# Patient Record
Sex: Female | Born: 1995
Health system: Southern US, Community
[De-identification: ages and names within clinical notes are randomized; demographics above are authoritative.]

## PROBLEM LIST (undated history)

## (undated) ENCOUNTER — Emergency Department (HOSPITAL_COMMUNITY): Discharge status: Absent without leave | Payer: BLUE CROSS/BLUE SHIELD

## (undated) ENCOUNTER — Inpatient Hospital Stay (HOSPITAL_COMMUNITY): Payer: Self-pay

## (undated) DIAGNOSIS — N390 Urinary tract infection, site not specified: Secondary | ICD-10-CM

## (undated) DIAGNOSIS — L732 Hidradenitis suppurativa: Secondary | ICD-10-CM

## (undated) DIAGNOSIS — F32A Depression, unspecified: Secondary | ICD-10-CM

## (undated) DIAGNOSIS — A599 Trichomoniasis, unspecified: Secondary | ICD-10-CM

## (undated) DIAGNOSIS — S82899A Other fracture of unspecified lower leg, initial encounter for closed fracture: Secondary | ICD-10-CM

## (undated) DIAGNOSIS — J45909 Unspecified asthma, uncomplicated: Secondary | ICD-10-CM

## (undated) HISTORY — PX: NO PAST SURGERIES: SHX2092

---
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
## 2016-01-03 ENCOUNTER — Encounter (HOSPITAL_COMMUNITY): Payer: Self-pay | Admitting: *Deleted

## 2016-01-03 ENCOUNTER — Emergency Department (HOSPITAL_COMMUNITY): Payer: Medicaid Other

## 2016-01-03 ENCOUNTER — Emergency Department (HOSPITAL_COMMUNITY)
Admission: EM | Admit: 2016-01-03 | Discharge: 2016-01-03 | Disposition: A | Discharge status: Home / Self Care | Payer: Medicaid Other | Attending: Emergency Medicine | Admitting: Emergency Medicine

## 2016-01-03 DIAGNOSIS — R51 Headache: Secondary | ICD-10-CM | POA: Diagnosis present

## 2016-01-03 DIAGNOSIS — Z79899 Other long term (current) drug therapy: Secondary | ICD-10-CM | POA: Insufficient documentation

## 2016-01-03 DIAGNOSIS — Z87891 Personal history of nicotine dependence: Secondary | ICD-10-CM | POA: Insufficient documentation

## 2016-01-03 DIAGNOSIS — G43909 Migraine, unspecified, not intractable, without status migrainosus: Secondary | ICD-10-CM | POA: Diagnosis not present

## 2016-01-03 DIAGNOSIS — Z7951 Long term (current) use of inhaled steroids: Secondary | ICD-10-CM | POA: Insufficient documentation

## 2016-01-03 DIAGNOSIS — R030 Elevated blood-pressure reading, without diagnosis of hypertension: Secondary | ICD-10-CM | POA: Diagnosis not present

## 2016-01-03 DIAGNOSIS — Z792 Long term (current) use of antibiotics: Secondary | ICD-10-CM | POA: Insufficient documentation

## 2016-01-03 DIAGNOSIS — J45909 Unspecified asthma, uncomplicated: Secondary | ICD-10-CM | POA: Diagnosis not present

## 2016-01-03 MED ORDER — KETOROLAC TROMETHAMINE 30 MG/ML IJ SOLN
30.0000 mg | Freq: Once | INTRAMUSCULAR | Status: AC
  Administered 2016-01-03: 30 mg via INTRAVENOUS
  Filled 2016-01-03: qty 1

## 2016-01-03 MED ORDER — METOCLOPRAMIDE HCL 5 MG/ML IJ SOLN
10.0000 mg | Freq: Once | INTRAMUSCULAR | Status: AC
  Administered 2016-01-03: 10 mg via INTRAVENOUS
  Filled 2016-01-03: qty 2

## 2016-01-03 MED ORDER — DIPHENHYDRAMINE HCL 50 MG/ML IJ SOLN
25.0000 mg | Freq: Once | INTRAMUSCULAR | Status: AC
  Administered 2016-01-03: 25 mg via INTRAVENOUS
  Filled 2016-01-03: qty 1

## 2016-01-03 NOTE — ED Provider Notes (Signed)
CSN: [id number]     Arrival date & time 01/03/16  1406 History   First MD Initiated Contact with Patient 01/03/16 1759     Chief Complaint  Patient presents with  . Eye Pain  . Hypertension     (Consider location/radiation/quality/duration/timing/severity/associated sxs/prior Treatment) Patient is a 20 y.o. female presenting with eye pain. The history is provided by the patient and medical records. No language interpreter was used.  Eye Pain Associated symptoms include headaches and nausea. Pertinent negatives include no abdominal pain, chills, congestion, coughing, fever, neck pain, rash, sore throat, vomiting or weakness.   Crissie SicklesJakeenya S Pennypacker is a 20 y.o. female  with a PMH of asthma who presents to the Emergency Department complaining of throbbing head ache x 1 week. Patient states headache began at the base of her head and radiated up across entire head bilaterally one week ago. 2 days ago, headache began around her left eye and was described as throbbing. She took Tylenol with minimal relief. Patient states headache has been constant, however at times it is better/worse. No aggravating or alleviating factors were noted. Admits to associated photophobia and phonophobia. + nausea, no emesis/abdominal pain. No other medications taken prior to arrival for symptoms. No history of migraines or similar headaches. Denies head trauma or injury. No numbness or tingling, no chest pain, no shortness of breath.  Additionally, patient states she was using her mother's home blood pressure cuff to check her BP and SBP was in the 190s. Upon arrival to ED, BP was 120/78. She has no history of hypertension and states she has never been told she has high blood pressure in the past.  Past Medical History  Diagnosis Date  . Asthma    History reviewed. No pertinent past surgical history. History reviewed. No pertinent family history. Social History  Substance Use Topics  . Smoking status: Former Games developermoker  .  Smokeless tobacco: None  . Alcohol Use: No   OB History    No data available     Review of Systems  Constitutional: Negative for fever and chills.  HENT: Negative for congestion and sore throat.   Eyes: Positive for photophobia. Negative for visual disturbance.  Respiratory: Negative for cough, shortness of breath and wheezing.   Cardiovascular: Negative.   Gastrointestinal: Positive for nausea. Negative for vomiting and abdominal pain.  Genitourinary: Negative for dysuria.  Musculoskeletal: Negative for neck pain and neck stiffness.  Skin: Negative for rash.  Neurological: Positive for headaches. Negative for dizziness and weakness.     Allergies  Review of patient's allergies indicates no known allergies.  Home Medications   Prior to Admission medications   Medication Sig Start Date End Date Taking? Authorizing Provider  acetaminophen (TYLENOL) 325 MG tablet Take 325 mg by mouth every 6 (six) hours as needed for moderate pain.   Yes Historical Provider, MD  albuterol (PROVENTIL HFA;VENTOLIN HFA) 108 (90 BASE) MCG/ACT inhaler Inhale 4 puffs into the lungs every 4 (four) hours as needed for wheezing or shortness of breath. 05/01/13  Yes Niel Hummeross Kuhner, MD  fluticasone (FLONASE) 50 MCG/ACT nasal spray Place 2 sprays into both nostrils daily. 05/05/14   Robyn M Hess, PA-C  guaiFENesin (MUCINEX) 600 MG 12 hr tablet Take 1 tablet (600 mg total) by mouth 2 (two) times daily. 05/05/14   Kathrynn Speedobyn M Hess, PA-C  HYDROcodone-acetaminophen (NORCO/VICODIN) 5-325 MG per tablet Take 1 tablet by mouth every 6 (six) hours as needed for moderate pain. 11/10/14   Charlestine Nighthristopher Lawyer, PA-C  ibuprofen (ADVIL,MOTRIN) 800 MG tablet Take 1 tablet (800 mg total) by mouth every 8 (eight) hours as needed. 11/10/14   Charlestine Night, PA-C  penicillin v potassium (VEETID) 500 MG tablet Take 1 tablet (500 mg total) by mouth 4 (four) times daily. 11/10/14   Charlestine Night, PA-C  Spacer/Aero-Holding Chambers (AEROCHAMBER  PLUS WITH MASK) inhaler Use with inhaler 05/01/13   Niel Hummer, MD  traMADol (ULTRAM) 50 MG tablet Take 1 tablet (50 mg total) by mouth every 6 (six) hours as needed for severe pain. 09/17/14   Fayrene Helper, PA-C   BP 145/64 mmHg  Pulse 62  Temp(Src) 98.6 F (37 C) (Oral)  Resp 18  SpO2 100%  LMP 12/20/2015 Physical Exam  Constitutional: She is oriented to person, place, and time. She appears well-developed and well-nourished. No distress.  HENT:  Head: Normocephalic and atraumatic.  Mouth/Throat: Oropharynx is clear and moist.  No tenderness of the temporal artery   Eyes: Conjunctivae and EOM are normal. Pupils are equal, round, and reactive to light. No scleral icterus.  No nystagmus   Neck: Normal range of motion. Neck supple.  Full active and passive ROM without pain.  No midline or paraspinal tenderness. No nuchal rigidity or meningeal signs.  Cardiovascular: Normal rate, regular rhythm, normal heart sounds and intact distal pulses.   Pulmonary/Chest: Effort normal and breath sounds normal. No respiratory distress. She has no wheezes. She has no rales. She exhibits no tenderness.  Abdominal: Soft. Bowel sounds are normal. She exhibits no distension. There is no tenderness. There is no rebound and no guarding.  Musculoskeletal: Normal range of motion.  Lymphadenopathy:    She has no cervical adenopathy.  Neurological: She is alert and oriented to person, place, and time. She has normal reflexes. No cranial nerve deficit. Coordination normal.  Mental Status: Alert, oriented, and thought content is appropriate. Speech is fluent without evidence of aphasia. Able to follow two-step commands without difficulty.  Cranial Nerves:  II - Peripheral visual fields grossly normal, pupils equal, round, reactive to light III, IV, VI - Bilateral EOM intact, no ptosis V - Facial light touch sensation intact and equal VII - Facial symmetry: smile, raised eyebrows ; Eyelids kept closed against  resistance VIII - Hearing grossly normal bilaterally  IX, X - Uvula midline XI - Bilateral shoulder shrug equal and strong XII - Tongue extension midline Motor:  5/5 muscle strength of upper and lower extremities bilaterally including strong and equal grip strength and plantar/dorsiflexion.  Sensory:  Light touch sensory intact.   Skin: Skin is warm and dry. No rash noted. She is not diaphoretic.  Psychiatric: She has a normal mood and affect. Her behavior is normal. Judgment and thought content normal.  Nursing note and vitals reviewed.   ED Course  Procedures (including critical care time) Labs Review Labs Reviewed - No data to display  Imaging Review Ct Head Wo Contrast  01/03/2016  CLINICAL DATA:  One week history of headache EXAM: CT HEAD WITHOUT CONTRAST TECHNIQUE: Contiguous axial images were obtained from the base of the skull through the vertex without intravenous contrast. COMPARISON:  None. FINDINGS: The ventricles are normal in size and configuration. There is no intracranial mass, hemorrhage, extra-axial fluid collection, or midline shift. Gray-white compartments appear normal. No acute infarct evident. Bony calvarium appears intact. The mastoid air cells are clear. Visualized orbits appear symmetric bilaterally. IMPRESSION: Study within normal limits. Electronically Signed   By: Bretta Bang III M.D.   On: 01/03/2016 20:12  I have personally reviewed and evaluated these images and lab results as part of my medical decision-making.   EKG Interpretation None      MDM   Final diagnoses:  Migraine without status migrainosus, not intractable, unspecified migraine type   Crissie Sickles presents to ED for headache 1 week, acute worsening over the last 2 days. No head trauma. No focal neuro deficits on exam. Given no past medical history of migraine/similar headaches will obtain head CT. Migraine cocktail given.  CT head normal.   8:08 PM - patient reevaluated  and feels much improved, headache resolved.  Evaluation does not show pathology that would require ongoing emergent intervention or inpatient treatment. Patient is hemodynamically stable and mentating appropriately. PCP follow up strongly encourage. Return precautions discussed and all questions answered.  Discussed findings and plan with patient who agrees with treatment plan as dictated.   [facility] Ward, PA-C 01/03/16 2023  Mancel Bale, MD 01/03/16 2226

## 2016-01-03 NOTE — Discharge Instructions (Signed)
1. Medications: usual home medications 2. Treatment: rest, drink plenty of fluids, if headache persists take ibuprofen with caffeine 3. Follow Up: Please follow up with your primary doctor in 3 days for discussion of your diagnoses and further evaluation after today's visit- please call phone number as we discussed for helping finding a primary physician. Please return to the ER for double vision, speech difficulty, gait disturbance, persistent vomiting or other concerns.

## 2016-01-03 NOTE — ED Notes (Signed)
Pt reports discomfort to back of left eye x 2 days. Pt has checked bp at home and states its been high. No neuro deficits noted at triage, bp 120/78.

## 2016-01-03 NOTE — ED Notes (Signed)
Patient transported to CT scan . 

## 2016-06-05 ENCOUNTER — Emergency Department (HOSPITAL_COMMUNITY)
Admission: EM | Admit: 2016-06-05 | Discharge: 2016-06-05 | Disposition: A | Discharge status: Home / Self Care | Payer: Medicaid Other | Attending: Emergency Medicine | Admitting: Emergency Medicine

## 2016-06-05 ENCOUNTER — Encounter (HOSPITAL_COMMUNITY): Payer: Self-pay | Admitting: Emergency Medicine

## 2016-06-05 DIAGNOSIS — J45901 Unspecified asthma with (acute) exacerbation: Secondary | ICD-10-CM | POA: Insufficient documentation

## 2016-06-05 DIAGNOSIS — J069 Acute upper respiratory infection, unspecified: Secondary | ICD-10-CM

## 2016-06-05 DIAGNOSIS — Z87891 Personal history of nicotine dependence: Secondary | ICD-10-CM | POA: Insufficient documentation

## 2016-06-05 MED ORDER — ALBUTEROL SULFATE HFA 108 (90 BASE) MCG/ACT IN AERS
1.0000 | INHALATION_SPRAY | Freq: Once | RESPIRATORY_TRACT | Status: AC
  Administered 2016-06-05: 1 via RESPIRATORY_TRACT
  Filled 2016-06-05: qty 6.7

## 2016-06-05 MED ORDER — PREDNISONE 20 MG PO TABS
60.0000 mg | ORAL_TABLET | Freq: Once | ORAL | Status: AC
  Administered 2016-06-05: 60 mg via ORAL
  Filled 2016-06-05: qty 3

## 2016-06-05 MED ORDER — PREDNISONE 10 MG PO TABS: 60.0000 mg | ORAL_TABLET | Freq: Every day | ORAL | 0 refills | Status: DC

## 2016-06-05 MED ORDER — ALBUTEROL (5 MG/ML) CONTINUOUS INHALATION SOLN
10.0000 mg/h | INHALATION_SOLUTION | Freq: Once | RESPIRATORY_TRACT | Status: AC
  Administered 2016-06-05: 10 mg/h via RESPIRATORY_TRACT
  Filled 2016-06-05: qty 20

## 2016-06-05 MED ORDER — IPRATROPIUM BROMIDE 0.02 % IN SOLN
0.5000 mg | Freq: Once | RESPIRATORY_TRACT | Status: AC
  Administered 2016-06-05: 0.5 mg via RESPIRATORY_TRACT
  Filled 2016-06-05: qty 2.5

## 2016-06-05 NOTE — Discharge Instructions (Signed)
We saw you in the ER for chest discomfort and shortness of breath. We think what you have is a viral syndrome - the treatment for which is symptomatic relief only, and your body will fight the infection off in a few days. We are prescribing you some meds for asthma flair.  Please return to the ER if your symptoms worsen; you have increased pain, fevers, chills, worsening shortness of breath.

## 2016-06-05 NOTE — Progress Notes (Signed)
RT did PEAK Flow with patient. Patient got 150 on peak flow. Patient not able to take deep breath in at this time. RT started continuous neb

## 2016-06-05 NOTE — ED Provider Notes (Signed)
[facility]-EMERGENCY DEPT Provider Note   CSN: [id number] Arrival date & time: 06/05/16  0524     History   Chief Complaint Chief Complaint  Patient presents with  . Chest Pain    HPI Pamela Wong is a 20 y.o. female.  HPI SUBJECTIVE:  Pamela Wong is a 20 y.o. female seen urgently with "cold like symptoms" for the few days. Pt reports that she had some chest discomfort, midsternal in the middle of the night, so she came to the ER. Pt has possible wheezing/ She also has a dry cough. No sore throat. Pt has subjective fevers. Associated symptoms:congestion, sneezing, dry cough, myalgias, fever and chills. Current asthma medications: none. Patient denies smoke cigarettes.  OBJECTIVE:  The patient appears alert, well appearing, and in no distress, oriented to person, place, and time, acyanotic, in no respiratory distress and well hydrated. ENT: ENT exam normal, no neck nodes or sinus tenderness, neck without nodes, pharynx erythematous without exudate and nasal mucosa congested CHEST:clear to auscultation, poor aeration, no wheezes, rales or rhonchi, symmetric air entry, no tachypnea, retractions or cyanosis    Past Medical History:  Diagnosis Date  . Asthma     There are no active problems to display for this patient.   History reviewed. No pertinent surgical history.  OB History    No data available       Home Medications    Prior to Admission medications   Medication Sig Start Date End Date Taking? Authorizing Provider  acetaminophen (TYLENOL) 325 MG tablet Take 325 mg by mouth every 6 (six) hours as needed for moderate pain.   Yes Historical Provider, MD  albuterol (PROVENTIL HFA;VENTOLIN HFA) 108 (90 BASE) MCG/ACT inhaler Inhale 4 puffs into the lungs every 4 (four) hours as needed for wheezing or shortness of breath. 05/01/13  Yes Niel Hummeross Kuhner, MD  predniSONE (DELTASONE) 10 MG tablet Take 6 tablets (60 mg total) by mouth daily. 06/05/16   Derwood KaplanAnkit Cejay Cambre, MD     Family History No family history on file.  Social History Social History  Substance Use Topics  . Smoking status: Former Games developermoker  . Smokeless tobacco: Never Used  . Alcohol use No     Allergies   Review of patient's allergies indicates no known allergies.   Review of Systems Review of Systems  ROS 10 Systems reviewed and are negative for acute change except as noted in the HPI.     Physical Exam Updated Vital Signs BP 134/95   Pulse (!) 122   Temp 99.5 F (37.5 C) (Oral)   Resp 22   Ht 5' 6.5" (1.689 m)   Wt 190 lb (86.2 kg)   LMP 06/05/2016 (Exact Date)   SpO2 100%   BMI 30.21 kg/m   Physical Exam  Constitutional: She is oriented to person, place, and time. She appears well-developed.  HENT:  Head: Normocephalic and atraumatic.  Eyes: Conjunctivae and EOM are normal. Pupils are equal, round, and reactive to light.  Neck: Normal range of motion. Neck supple.  Cardiovascular: Normal rate, regular rhythm and normal heart sounds.   Pulmonary/Chest: Effort normal and breath sounds normal. No respiratory distress. She has no wheezes. She has no rales.  Abdominal: Soft. Bowel sounds are normal. She exhibits no distension. There is no tenderness. There is no rebound and no guarding.  Musculoskeletal: She exhibits no edema, tenderness or deformity.  Neurological: She is alert and oriented to person, place, and time.  Skin: Skin is warm and dry.  Nursing note and vitals reviewed.    ED Treatments / Results  Labs (all labs ordered are listed, but only abnormal results are displayed) Labs Reviewed - No data to display  EKG  EKG Interpretation  Date/Time:  Monday June 05 2016 05:36:46 EDT Ventricular Rate:  90 PR Interval:    QRS Duration: 81 QT Interval:  340 QTC Calculation: 416 R Axis:   63 Text Interpretation:  Sinus rhythm Nonspecific T wave abnormality  in lead III No acute changes Confirmed by Rhunette Croft, MD, Janey Genta ([id number]) on 06/05/2016 6:58:46  AM       Radiology No results found.  Procedures Procedures (including critical care time)  Medications Ordered in ED Medications  albuterol (PROVENTIL HFA;VENTOLIN HFA) 108 (90 Base) MCG/ACT inhaler 1 puff (not administered)  albuterol (PROVENTIL,VENTOLIN) solution continuous neb (10 mg/hr Nebulization Given 06/05/16 0606)  predniSONE (DELTASONE) tablet 60 mg (60 mg Oral Given 06/05/16 0559)  ipratropium (ATROVENT) nebulizer solution 0.5 mg (0.5 mg Nebulization Given 06/05/16 0606)     Initial Impression / Assessment and Plan / ED Course  I have reviewed the triage vital signs and the nursing notes.  Pertinent labs & imaging results that were available during my care of the patient were reviewed by me and considered in my medical decision making (see chart for details).  Clinical Course  Comment By Time  Repeat exam reveals clearing of wheezing in all lung fields. Patient is not in any respiratory distress nor is there hypoxia.  Derwood Kaplan, MD 10/02 [phone]  Repeat exam reveals clearing improved aeration in all lung fields. Patient is not in any respiratory distress nor is there hypoxia. Still no wheezing or focal abnormal lung sounds - so CXR not ordered. Strict ER return precautions have been discussed, and patient is agreeing with the plan and is comfortable with the workup done and the recommendations from the ER.   Derwood Kaplan, MD 10/02 [phone]    Pt with URI like symptoms for the past few days comes in with some chest discomfort. Pt has cough. Lungs are clear - but tight. We will get nebs. No CXR for now. We will reassess post nebs. PE and ACS not high on the ddx. Screening EKG ordered. Trops not needed. Myocarditis/Pericarditis not appreciated based on hx, exam and ekg.  Final Clinical Impressions(s) / ED Diagnoses   Final diagnoses:  Acute URI  Exacerbation of asthma, unspecified asthma severity, unspecified whether persistent    New Prescriptions New  Prescriptions   PREDNISONE (DELTASONE) 10 MG TABLET    Take 6 tablets (60 mg total) by mouth daily.     Derwood Kaplan, MD 06/05/16 [phone]

## 2016-06-05 NOTE — ED Notes (Signed)
EDP at bedside  

## 2016-06-05 NOTE — ED Triage Notes (Signed)
Pt reports cold sx with dry cough. Pt reports generalized chest pain that woke her up this morning. Pt reports fever and chills and wheezing. Pt reports medical hx of asthma.

## 2016-12-11 ENCOUNTER — Encounter (HOSPITAL_COMMUNITY): Payer: Self-pay | Admitting: Emergency Medicine

## 2016-12-11 DIAGNOSIS — T5794XA Toxic effect of unspecified inorganic substance, undetermined, initial encounter: Secondary | ICD-10-CM | POA: Insufficient documentation

## 2016-12-11 DIAGNOSIS — Y999 Unspecified external cause status: Secondary | ICD-10-CM | POA: Insufficient documentation

## 2016-12-11 DIAGNOSIS — L739 Follicular disorder, unspecified: Secondary | ICD-10-CM | POA: Insufficient documentation

## 2016-12-11 DIAGNOSIS — Z23 Encounter for immunization: Secondary | ICD-10-CM | POA: Insufficient documentation

## 2016-12-11 DIAGNOSIS — Y929 Unspecified place or not applicable: Secondary | ICD-10-CM | POA: Insufficient documentation

## 2016-12-11 DIAGNOSIS — T2147XA Corrosion of unspecified degree of female genital region, initial encounter: Secondary | ICD-10-CM | POA: Insufficient documentation

## 2016-12-11 DIAGNOSIS — X58XXXA Exposure to other specified factors, initial encounter: Secondary | ICD-10-CM | POA: Insufficient documentation

## 2016-12-11 DIAGNOSIS — J45909 Unspecified asthma, uncomplicated: Secondary | ICD-10-CM | POA: Insufficient documentation

## 2016-12-11 DIAGNOSIS — Y939 Activity, unspecified: Secondary | ICD-10-CM | POA: Insufficient documentation
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
## 2016-12-12 ENCOUNTER — Emergency Department (HOSPITAL_COMMUNITY)
Admission: EM | Admit: 2016-12-12 | Discharge: 2016-12-12 | Disposition: A | Discharge status: Home / Self Care | Payer: Medicaid Other | Attending: Emergency Medicine | Admitting: Emergency Medicine

## 2016-12-12 DIAGNOSIS — L739 Follicular disorder, unspecified: Secondary | ICD-10-CM

## 2016-12-12 DIAGNOSIS — T304 Corrosion of unspecified body region, unspecified degree: Secondary | ICD-10-CM

## 2016-12-12 MED ORDER — BACITRACIN ZINC 500 UNIT/GM EX OINT: 1.0000 "application " | TOPICAL_OINTMENT | Freq: Two times a day (BID) | CUTANEOUS | 0 refills | Status: DC

## 2016-12-12 MED ORDER — OXYCODONE-ACETAMINOPHEN 5-325 MG PO TABS
1.0000 | ORAL_TABLET | Freq: Once | ORAL | Status: AC
  Administered 2016-12-12: 1 via ORAL
  Filled 2016-12-12: qty 1

## 2016-12-12 MED ORDER — NAPROXEN 500 MG PO TABS: 500.0000 mg | ORAL_TABLET | Freq: Two times a day (BID) | ORAL | 0 refills | Status: DC

## 2016-12-12 MED ORDER — TETANUS-DIPHTH-ACELL PERTUSSIS 5-2.5-18.5 LF-MCG/0.5 IM SUSP
0.5000 mL | Freq: Once | INTRAMUSCULAR | Status: AC
  Administered 2016-12-12: 0.5 mL via INTRAMUSCULAR
  Filled 2016-12-12: qty 0.5

## 2016-12-12 MED ORDER — SILVER SULFADIAZINE 1 % EX CREA
TOPICAL_CREAM | Freq: Two times a day (BID) | CUTANEOUS | Status: DC
  Administered 2016-12-12: 02:00:00 via TOPICAL
  Filled 2016-12-12: qty 85

## 2016-12-12 NOTE — ED Notes (Signed)
Pt verbalized understanding discharge instructions and denies any further needs or questions at this time. VS stable, ambulatory and steady gait.   
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
## 2017-02-18 ENCOUNTER — Emergency Department (HOSPITAL_COMMUNITY)
Admission: EM | Admit: 2017-02-18 | Discharge: 2017-02-19 | Disposition: A | Discharge status: Home / Self Care | Payer: Medicaid Other | Attending: Emergency Medicine | Admitting: Emergency Medicine

## 2017-02-18 ENCOUNTER — Encounter (HOSPITAL_COMMUNITY): Payer: Self-pay

## 2017-02-18 DIAGNOSIS — L02415 Cutaneous abscess of right lower limb: Secondary | ICD-10-CM | POA: Insufficient documentation

## 2017-02-18 DIAGNOSIS — Z87891 Personal history of nicotine dependence: Secondary | ICD-10-CM | POA: Insufficient documentation

## 2017-02-18 DIAGNOSIS — J45909 Unspecified asthma, uncomplicated: Secondary | ICD-10-CM | POA: Insufficient documentation

## 2017-02-18 DIAGNOSIS — L0291 Cutaneous abscess, unspecified: Secondary | ICD-10-CM

## 2017-02-18 DIAGNOSIS — Z79899 Other long term (current) drug therapy: Secondary | ICD-10-CM | POA: Insufficient documentation

## 2017-02-18 DIAGNOSIS — L732 Hidradenitis suppurativa: Secondary | ICD-10-CM | POA: Insufficient documentation

## 2017-02-18 MED ORDER — SULFAMETHOXAZOLE-TRIMETHOPRIM 800-160 MG PO TABS: 1.0000 | ORAL_TABLET | Freq: Two times a day (BID) | ORAL | 0 refills | Status: AC

## 2017-02-18 MED ORDER — LIDOCAINE-EPINEPHRINE (PF) 2 %-1:200000 IJ SOLN
20.0000 mL | Freq: Once | INTRAMUSCULAR | Status: AC
  Administered 2017-02-18: 20 mL
  Filled 2017-02-18: qty 20

## 2017-02-18 MED ORDER — HYDROCODONE-ACETAMINOPHEN 5-325 MG PO TABS: 1.0000 | ORAL_TABLET | ORAL | 0 refills | Status: DC | PRN

## 2017-02-18 NOTE — ED Triage Notes (Signed)
Onset 2 days bump on inside upper right thigh.  Pt reports she gets two a month and usually burst on their own.  This bump is worse and larger.

## 2017-02-18 NOTE — Discharge Instructions (Signed)
Please take Ibuprofen (Advil, motrin) and Tylenol (acetaminophen) to relieve your pain.  You may take up to 800 MG (4 pills) of normal strength ibuprofen every 8 hours as needed.  In between doses of ibuprofen you make take tylenol, up to 1,000 mg (two extra strength pills).  Do not take more than 3,000 mg tylenol in a 24 hour period.  Please check all medication labels as many medications such as pain and cold medications may contain tylenol.  Do not drink while taking these medications.  Do not take other NSAID'S while taking ibuprofen (such as aleve or naproxen).  Your prescription pain medication contains tylenol.     You are being prescribed a medication which may make you sleepy o not drive, operate heavy machinery, care for a small child with out another adult present, or perform any activities that may cause harm to you or someone else if you were to fall asleep or be impaired. Please fat least 24 hours after taking one dose.

## 2017-02-18 NOTE — ED Provider Notes (Signed)
[facility]-EMERGENCY DEPT Provider Note   CSN: [id number] Arrival date & time: 02/18/17  2111  By signing my name below, I, Pamela Wong, attest that this documentation has been prepared under the direction and in the presence of non-physician practitioner, Lyndel Safe, PA-C. Electronically Signed: Modena Wong, Scribe. 02/18/2017. 11:10 PM.  History   Chief Complaint Chief Complaint  Patient presents with  . Abscess   The history is provided by the patient. No language interpreter was used.   HPI Comments: Pamela Wong is a 21 y.o. female who presents to the Emergency Department complaining of constant moderate right thigh bump that started yesterday. She states her bump is worsening. She typically gets  Bumps in the same area twice a month that resolve on their own, but this time is more severe. She has a long-standing history of getting boils to her upper inner thighs, inguinal area, and bilateral axilla however has never seen a doctor for them before. She describes the bump as red, painful, and swollen. She reports associated nausea. Denies any chance of pregnancy (is in a committed relationship with her girlfriend), fever, chills, vomiting, or other complaints at this time.   PCP: None  Past Medical History:  Diagnosis Date  . Asthma     There are no active problems to display for this patient.   History reviewed. No pertinent surgical history.  OB History    No data available        Home Medications    Prior to Admission medications   Medication Sig Start Date End Date Taking? Authorizing Provider  acetaminophen (TYLENOL) 325 MG tablet Take 325 mg by mouth every 6 (six) hours as needed for moderate pain.    [provider]  albuterol (PROVENTIL HFA;VENTOLIN HFA) 108 (90 BASE) MCG/ACT inhaler Inhale 4 puffs into the lungs every 4 (four) hours as needed for wheezing or shortness of breath. 05/01/13   Niel Hummer, MD  bacitracin ointment Apply 1  application topically 2 (two) times daily. 12/12/16   Janne Napoleon, NP  HYDROcodone-acetaminophen (NORCO/VICODIN) 5-325 MG tablet Take 1 tablet by mouth every 4 (four) hours as needed for severe pain. 02/18/17   Cristina Gong, PA-C  naproxen (NAPROSYN) 500 MG tablet Take 1 tablet (500 mg total) by mouth 2 (two) times daily. 12/12/16   Janne Napoleon, NP  predniSONE (DELTASONE) 10 MG tablet Take 6 tablets (60 mg total) by mouth daily. 06/05/16   Derwood Kaplan, MD  sulfamethoxazole-trimethoprim (BACTRIM DS,SEPTRA DS) 800-160 MG tablet Take 1 tablet by mouth 2 (two) times daily. 02/18/17 02/25/17  Cristina Gong, PA-C    Family History History reviewed. No pertinent family history.  Social History Social History  Substance Use Topics  . Smoking status: Former Smoker    Years: 10.00    Types: Cigarettes    Quit date: 01/2016  . Smokeless tobacco: Never Used  . Alcohol use No     Allergies   Patient has no known allergies.   Review of Systems Review of Systems  Constitutional: Negative for chills and fever.  Gastrointestinal: Positive for nausea. Negative for vomiting.  Musculoskeletal: Positive for myalgias.  Skin: Positive for color change.       +bump     Physical Exam Updated Vital Signs BP (!) 142/78 (BP Location: Left Arm)   Pulse 91   Temp 98.4 F (36.9 C) (Oral)   Resp 18   LMP 01/23/2017   SpO2 100%   Physical Exam  Constitutional:  She appears well-developed and well-nourished. No distress.  HENT:  Head: Normocephalic.  Cardiovascular: Normal rate and intact distal pulses.   Neurological: She is alert. No sensory deficit.  Skin: Skin is warm and dry.  6x8 area of erythema to right upper medial thigh. There is a 1 cm x 1 cm area of fluctuance in the middle. The entire area is tender to palpation. No obvious drainage.  Psychiatric: She has a normal mood and affect. Her behavior is normal.  Nursing note and vitals reviewed.    ED Treatments /  Results  DIAGNOSTIC STUDIES: Oxygen Saturation is 100% on RA, normal by my interpretation.    COORDINATION OF CARE: 11:14 PM- Pt advised of plan for treatment and pt agrees.  Labs (all labs ordered are listed, but only abnormal results are displayed) Labs Reviewed - No data to display  EKG  EKG Interpretation None       Radiology No results found.  Procedures .Marland KitchenIncision and Drainage Date/Time: 02/18/2017 11:18 PM Performed by: Cristina Gong Authorized by: Cristina Gong   Consent:    Consent obtained:  Verbal   Consent given by:  Patient   Risks discussed:  Bleeding, incomplete drainage, pain, infection and damage to other organs   Alternatives discussed:  No treatment, referral and alternative treatment Location:    Type:  Abscess   Location:  Lower extremity   Lower extremity location:  Leg   Leg location:  R upper leg Pre-procedure details:    Skin preparation:  Chloraprep Anesthesia (see MAR for exact dosages):    Anesthesia method:  Local infiltration   Local anesthetic:  Lidocaine 2% WITH epi Procedure type:    Complexity:  Simple Procedure details:    Incision types:  Stab incision   Incision depth:  Dermal   Scalpel blade:  11   Wound management:  Irrigated with saline and probed and deloculated   Drainage:  Bloody and purulent   Drainage amount:  Scant   Wound treatment:  Wound left open   Packing materials:  None Post-procedure details:    Patient tolerance of procedure:  Tolerated well, no immediate complications Comments:     Before and after the procedure the motor function,strength, sensation, and circulatory function was assessed.  After the procedure patient had intact motor function with 5/5 strength (equal to unaffected side) to all joints distal to the incision site with the only sensory changes attributable to the local anesthetic.       Medications Ordered in ED Medications  lidocaine-EPINEPHrine (XYLOCAINE W/EPI) 2  %-1:200000 (PF) injection 20 mL (20 mLs Infiltration Given 02/18/17 2311)     Initial Impression / Assessment and Plan / ED Course  I have reviewed the triage vital signs and the nursing notes.  Pertinent labs & imaging results that were available during my care of the patient were reviewed by me and considered in my medical decision making (see chart for details).     Patient with skin abscess amenable to incision and drainage.  Patient afebrile and not tachycardic. Abscess was not large enough to warrant packing or drain,  wound recheck in 2 days. Encouraged home warm soaks and flushing.  Mild signs of cellulitis is surrounding skin.  Patient instructed on OTC pain management, and given very limited supply of Norco for breakthrough pain. Will d/c to home with antibiotics based on recurrent nature of infection.  Patient given referral to wellness clinic for further evaluation of these bumps.    Final  Clinical Impressions(s) / ED Diagnoses   Final diagnoses:  Abscess  Hidradenitis    New Prescriptions New Prescriptions   HYDROCODONE-ACETAMINOPHEN (NORCO/VICODIN) 5-325 MG TABLET    Take 1 tablet by mouth every 4 (four) hours as needed for severe pain.   SULFAMETHOXAZOLE-TRIMETHOPRIM (BACTRIM DS,SEPTRA DS) 800-160 MG TABLET    Take 1 tablet by mouth 2 (two) times daily.   I personally performed the services described in this documentation, which was scribed in my presence. The recorded information has been reviewed and is accurate.     Cristina GongHammond, Kaylean Tupou W, PA-C 02/19/17 0101    Benjiman CorePickering, Nathan, MD 02/20/17 [phone]

## 2017-07-25 ENCOUNTER — Other Ambulatory Visit: Payer: Self-pay

## 2017-07-25 DIAGNOSIS — Z87891 Personal history of nicotine dependence: Secondary | ICD-10-CM | POA: Insufficient documentation

## 2017-07-25 DIAGNOSIS — J45909 Unspecified asthma, uncomplicated: Secondary | ICD-10-CM | POA: Insufficient documentation

## 2017-07-25 DIAGNOSIS — N611 Abscess of the breast and nipple: Secondary | ICD-10-CM | POA: Insufficient documentation

## 2017-07-25 DIAGNOSIS — Z79899 Other long term (current) drug therapy: Secondary | ICD-10-CM | POA: Insufficient documentation

## 2017-07-26 ENCOUNTER — Encounter (HOSPITAL_COMMUNITY): Payer: Self-pay | Admitting: Emergency Medicine

## 2017-07-26 ENCOUNTER — Emergency Department (HOSPITAL_COMMUNITY)
Admission: EM | Admit: 2017-07-26 | Discharge: 2017-07-26 | Disposition: A | Discharge status: Home / Self Care | Payer: Self-pay | Attending: Emergency Medicine | Admitting: Emergency Medicine

## 2017-07-26 DIAGNOSIS — N611 Abscess of the breast and nipple: Secondary | ICD-10-CM

## 2017-07-26 MED ORDER — CEPHALEXIN 500 MG PO CAPS: 500.0000 mg | ORAL_CAPSULE | Freq: Four times a day (QID) | ORAL | 0 refills | Status: DC

## 2017-07-26 MED ORDER — SULFAMETHOXAZOLE-TRIMETHOPRIM 800-160 MG PO TABS: 1.0000 | ORAL_TABLET | Freq: Two times a day (BID) | ORAL | 0 refills | Status: AC

## 2017-07-26 NOTE — ED Provider Notes (Signed)
[facility]   CSN: [id number] Arrival date & time: 07/25/17  2351     History   Chief Complaint Chief Complaint  Patient presents with  . Abscess    Breast    HPI Pamela Wong is a 21 y.o. female.  Patient is a 21 year old female with past medical history of asthma and recurrent skin abscesses presenting for evaluation of pain and swelling to the left breast.  This started several days ago and is worsening.  She had drainage from 1 of the swollen areas and presents for evaluation of this.  She denies any fevers or chills.  She denies any vomiting or diarrhea.      Past Medical History:  Diagnosis Date  . Asthma     There are no active problems to display for this patient.   History reviewed. No pertinent surgical history.  OB History    No data available       Home Medications    Prior to Admission medications   Medication Sig Start Date End Date Taking? Authorizing Provider  acetaminophen (TYLENOL) 325 MG tablet Take 325 mg by mouth every 6 (six) hours as needed for moderate pain.    [provider]  albuterol (PROVENTIL HFA;VENTOLIN HFA) 108 (90 BASE) MCG/ACT inhaler Inhale 4 puffs into the lungs every 4 (four) hours as needed for wheezing or shortness of breath. 05/01/13   Niel HummerKuhner, Ross, MD  bacitracin ointment Apply 1 application topically 2 (two) times daily. 12/12/16   Janne NapoleonNeese, Hope M, NP  HYDROcodone-acetaminophen (NORCO/VICODIN) 5-325 MG tablet Take 1 tablet by mouth every 4 (four) hours as needed for severe pain. 02/18/17   Cristina GongHammond, Elizabeth W, PA-C  naproxen (NAPROSYN) 500 MG tablet Take 1 tablet (500 mg total) by mouth 2 (two) times daily. 12/12/16   Janne NapoleonNeese, Hope M, NP  predniSONE (DELTASONE) 10 MG tablet Take 6 tablets (60 mg total) by mouth daily. 06/05/16   Derwood KaplanNanavati, Ankit, MD    Family History No family history on file.  Social History Social History   Tobacco Use  . Smoking  status: Former Smoker    Years: 10.00    Types: Cigarettes    Last attempt to quit: 01/2016    Years since quitting: 1.5  . Smokeless tobacco: Never Used  Substance Use Topics  . Alcohol use: No  . Drug use: No     Allergies   Patient has no known allergies.   Review of Systems Review of Systems  All other systems reviewed and are negative.    Physical Exam Updated Vital Signs BP (!) 146/83   Pulse 79   Temp 98 F (36.7 C) (Oral)   Resp 17   Ht 5\' 5"  (1.651 m)   Wt 97.5 kg (215 lb)   SpO2 99%   BMI 35.78 kg/m   Physical Exam  Constitutional: She is oriented to person, place, and time. She appears well-developed and well-nourished.  HENT:  Head: Normocephalic and atraumatic.  Neck: Normal range of motion. Neck supple.  Musculoskeletal: Normal range of motion.  Neurological: She is alert and oriented to person, place, and time.  Skin: Skin is warm and dry.  There is an area of cellulitis with a central area of drainage to the left breast.  There are other similar areas to the right inner thigh.  Nursing note and vitals reviewed.    ED Treatments / Results  Labs (all labs ordered are listed, but only abnormal  results are displayed) Labs Reviewed - No data to display  EKG  EKG Interpretation None       Radiology No results found.  Procedures Procedures (including critical care time)  Medications Ordered in ED Medications - No data to display   Initial Impression / Assessment and Plan / ED Course  I have reviewed the triage vital signs and the nursing notes.  Pertinent labs & imaging results that were available during my care of the patient were reviewed by me and considered in my medical decision making (see chart for details).  Patient with a draining abscess to the left breast and folliculitis to the right groin.  From listening to her history, it sounds as though she has hidradenitis suppurativa causing recurrent skin infections.  This will be  treated with Keflex and Bactrim, warm soaks, and follow-up as needed.  She has asked for and will be provided with contact information for dermatology follow-up.  Final Clinical Impressions(s) / ED Diagnoses   Final diagnoses:  None    ED Discharge Orders    None       Geoffery Lyonselo, Mansa Willers, MD 07/26/17 0145

## 2017-07-26 NOTE — ED Triage Notes (Signed)
Patient reports 2 skin abscess at left breast onset 2 weeks ago with drainage and swelling . denies fever or chills .

## 2017-07-26 NOTE — Discharge Instructions (Signed)
Keflex and Bactrim as prescribed.  Apply warm soaks as frequently as possible for the next several days.  Return to the emergency department if symptoms significantly worsen or change.

## 2017-07-26 NOTE — ED Notes (Signed)
ED Provider at bedside. 

## 2017-10-17 ENCOUNTER — Other Ambulatory Visit: Payer: Self-pay

## 2017-10-17 ENCOUNTER — Emergency Department (HOSPITAL_COMMUNITY)
Admission: EM | Admit: 2017-10-17 | Discharge: 2017-10-18 | Disposition: A | Discharge status: Home / Self Care | Payer: Self-pay | Attending: Emergency Medicine | Admitting: Emergency Medicine

## 2017-10-17 ENCOUNTER — Encounter (HOSPITAL_COMMUNITY): Payer: Self-pay | Admitting: Emergency Medicine

## 2017-10-17 ENCOUNTER — Emergency Department (HOSPITAL_COMMUNITY): Payer: Self-pay

## 2017-10-17 DIAGNOSIS — J101 Influenza due to other identified influenza virus with other respiratory manifestations: Secondary | ICD-10-CM | POA: Insufficient documentation

## 2017-10-17 DIAGNOSIS — Z87891 Personal history of nicotine dependence: Secondary | ICD-10-CM | POA: Insufficient documentation

## 2017-10-17 DIAGNOSIS — J45909 Unspecified asthma, uncomplicated: Secondary | ICD-10-CM | POA: Insufficient documentation

## 2017-10-17 DIAGNOSIS — Z79899 Other long term (current) drug therapy: Secondary | ICD-10-CM | POA: Insufficient documentation

## 2017-10-17 LAB — URINALYSIS, ROUTINE W REFLEX MICROSCOPIC
BILIRUBIN URINE: NEGATIVE
Glucose, UA: NEGATIVE mg/dL
HGB URINE DIPSTICK: NEGATIVE
KETONES UR: NEGATIVE mg/dL
NITRITE: NEGATIVE
PH: 6 (ref 5.0–8.0)
Protein, ur: NEGATIVE mg/dL
SPECIFIC GRAVITY, URINE: 1.018 (ref 1.005–1.030)

## 2017-10-17 LAB — COMPREHENSIVE METABOLIC PANEL
ALK PHOS: 46 U/L (ref 38–126)
ALT: 19 U/L (ref 14–54)
ANION GAP: 11 (ref 5–15)
AST: 30 U/L (ref 15–41)
Albumin: 4 g/dL (ref 3.5–5.0)
BILIRUBIN TOTAL: 0.6 mg/dL (ref 0.3–1.2)
BUN: 6 mg/dL (ref 6–20)
CALCIUM: 8.7 mg/dL — AB (ref 8.9–10.3)
CO2: 20 mmol/L — ABNORMAL LOW (ref 22–32)
Chloride: 103 mmol/L (ref 101–111)
Creatinine, Ser: 0.64 mg/dL (ref 0.44–1.00)
Glucose, Bld: 90 mg/dL (ref 65–99)
POTASSIUM: 3.3 mmol/L — AB (ref 3.5–5.1)
Sodium: 134 mmol/L — ABNORMAL LOW (ref 135–145)
TOTAL PROTEIN: 8.1 g/dL (ref 6.5–8.1)

## 2017-10-17 LAB — CBC WITH DIFFERENTIAL/PLATELET
BASOS ABS: 0 10*3/uL (ref 0.0–0.1)
BASOS PCT: 0 %
Eosinophils Absolute: 0 10*3/uL (ref 0.0–0.7)
Eosinophils Relative: 0 %
HEMATOCRIT: 42.7 % (ref 36.0–46.0)
Hemoglobin: 14.1 g/dL (ref 12.0–15.0)
LYMPHS PCT: 42 %
Lymphs Abs: 1.4 10*3/uL (ref 0.7–4.0)
MCH: 29.7 pg (ref 26.0–34.0)
MCHC: 33 g/dL (ref 30.0–36.0)
MCV: 89.9 fL (ref 78.0–100.0)
Monocytes Absolute: 0.3 10*3/uL (ref 0.1–1.0)
Monocytes Relative: 8 %
NEUTROS ABS: 1.6 10*3/uL — AB (ref 1.7–7.7)
Neutrophils Relative %: 50 %
PLATELETS: 214 10*3/uL (ref 150–400)
RBC: 4.75 MIL/uL (ref 3.87–5.11)
RDW: 14.5 % (ref 11.5–15.5)
WBC: 3.3 10*3/uL — AB (ref 4.0–10.5)

## 2017-10-17 LAB — I-STAT CG4 LACTIC ACID, ED: Lactic Acid, Venous: 1.32 mmol/L (ref 0.5–1.9)

## 2017-10-17 LAB — I-STAT BETA HCG BLOOD, ED (MC, WL, AP ONLY): I-stat hCG, quantitative: <5 m[IU]/mL (ref <5)

## 2017-10-17 MED ORDER — SODIUM CHLORIDE 0.9 % IV BOLUS (SEPSIS): 1000.0000 mL | Freq: Once | INTRAVENOUS | Status: DC

## 2017-10-17 NOTE — ED Triage Notes (Signed)
Pt presents with flu-like symptoms x 3 days; pt reports lower back spasm and epigastric abd pain

## 2017-10-18 LAB — INFLUENZA PANEL BY PCR (TYPE A & B)
INFLAPCR: POSITIVE — AB
Influenza B By PCR: NEGATIVE

## 2017-10-18 MED ORDER — IBUPROFEN 800 MG PO TABS: 800.0000 mg | ORAL_TABLET | Freq: Three times a day (TID) | ORAL | 0 refills | Status: DC | PRN

## 2017-10-18 MED ORDER — ACETAMINOPHEN-CODEINE 120-12 MG/5ML PO SOLN: 10.0000 mL | ORAL | 0 refills | Status: DC | PRN

## 2017-10-18 MED ORDER — ALBUTEROL SULFATE HFA 108 (90 BASE) MCG/ACT IN AERS
2.0000 | INHALATION_SPRAY | RESPIRATORY_TRACT | Status: DC | PRN
  Administered 2017-10-18: 2 via RESPIRATORY_TRACT
  Filled 2017-10-18: qty 6.7

## 2017-10-18 MED ORDER — AEROCHAMBER PLUS FLO-VU LARGE MISC
Status: AC
  Filled 2017-10-18: qty 1

## 2017-10-18 MED ORDER — AEROCHAMBER PLUS FLO-VU LARGE MISC
Freq: Once | Status: AC
  Administered 2017-10-18: 1

## 2017-10-18 MED ORDER — GUAIFENESIN ER 1200 MG PO TB12: 1.0000 | ORAL_TABLET | Freq: Two times a day (BID) | ORAL | 0 refills | Status: DC

## 2017-10-18 NOTE — ED Notes (Signed)
Attempted IV access without success.  MD aware

## 2017-10-18 NOTE — ED Provider Notes (Signed)
[facility] Provider [facility]   CSN: [id number] Arrival date & time: 10/17/17  [id number]     History   Chief Complaint Chief Complaint  Patient presents with  . Generalized Body Aches  . Fever  . Cough    HPI Pamela Wong is a 22 y.o. female.  HPI Patient presents to the emergency department with flulike symptoms over the last 3 days.  The patient has had abdominal discomfort nausea cough body aches sore throat.  The patient did not take any medications prior to arrival.  Nothing seems to make the condition better or worse.  The patient denies chest pain, shortness of breath, headache,blurred vision, neck pain,  weakness, numbness, dizziness, anorexia, edema,  vomiting, diarrhea, rash, back pain, dysuria, hematemesis, bloody stool, near syncope, or syncope. Past Medical History:  Diagnosis Date  . Asthma     There are no active problems to display for this patient.   History reviewed. No pertinent surgical history.  OB History    No data available       Home Medications    Prior to Admission medications   Medication Sig Start Date End Date Taking? Authorizing Provider  acetaminophen (TYLENOL) 325 MG tablet Take 325 mg by mouth every 6 (six) hours as needed for moderate pain.    [provider]  albuterol (PROVENTIL HFA;VENTOLIN HFA) 108 (90 BASE) MCG/ACT inhaler Inhale 4 puffs into the lungs every 4 (four) hours as needed for wheezing or shortness of breath. 05/01/13   Niel HummerKuhner, Ross, MD  bacitracin ointment Apply 1 application topically 2 (two) times daily. 12/12/16   Janne NapoleonNeese, Hope M, NP  cephALEXin (KEFLEX) 500 MG capsule Take 1 capsule (500 mg total) by mouth 4 (four) times daily. 07/26/17   Geoffery Lyonselo, Douglas, MD  HYDROcodone-acetaminophen (NORCO/VICODIN) 5-325 MG tablet Take 1 tablet by mouth every 4 (four) hours as needed for severe pain. 02/18/17   Cristina GongHammond, Elizabeth W, PA-C  naproxen (NAPROSYN) 500 MG tablet Take 1 tablet (500  mg total) by mouth 2 (two) times daily. 12/12/16   Janne NapoleonNeese, Hope M, NP  predniSONE (DELTASONE) 10 MG tablet Take 6 tablets (60 mg total) by mouth daily. 06/05/16   Derwood KaplanNanavati, Ankit, MD    Family History History reviewed. No pertinent family history.  Social History Social History   Tobacco Use  . Smoking status: Former Smoker    Years: 10.00    Types: Cigarettes    Last attempt to quit: 01/2016    Years since quitting: 1.7  . Smokeless tobacco: Never Used  Substance Use Topics  . Alcohol use: No  . Drug use: No     Allergies   Patient has no known allergies.   Review of Systems Review of Systems All other systems negative except as documented in the HPI. All pertinent positives and negatives as reviewed in the HPI.  Physical Exam Updated Vital Signs BP (!) 142/104   Pulse 63   Temp 99.3 F (37.4 C) (Oral)   Resp 20   Ht 5\' 7"  (1.702 m)   Wt 85.7 kg (189 lb)   LMP 10/10/2017   SpO2 100%   BMI 29.60 kg/m   Physical Exam  Constitutional: She is oriented to person, place, and time. She appears well-developed and well-nourished. No distress.  HENT:  Head: Normocephalic and atraumatic.  Mouth/Throat: Oropharynx is clear and moist.  Eyes: Pupils are equal, round, and reactive to light.  Neck: Normal range of motion. Neck supple.  Cardiovascular:  Normal rate, regular rhythm and normal heart sounds. Exam reveals no gallop and no friction rub.  No murmur heard. Pulmonary/Chest: Effort normal and breath sounds normal. No respiratory distress. She has no wheezes.  Abdominal: Soft. Bowel sounds are normal. She exhibits no distension. There is no tenderness.  Neurological: She is alert and oriented to person, place, and time. She exhibits normal muscle tone. Coordination normal.  Skin: Skin is warm and dry. Capillary refill takes less than 2 seconds. No rash noted. No erythema.  Psychiatric: She has a normal mood and affect. Her behavior is normal.  Nursing note and vitals  reviewed.    ED Treatments / Results  Labs (all labs ordered are listed, but only abnormal results are displayed) Labs Reviewed  COMPREHENSIVE METABOLIC PANEL - Abnormal; Notable for the following components:      Result Value   Sodium 134 (*)    Potassium 3.3 (*)    CO2 20 (*)    Calcium 8.7 (*)    All other components within normal limits  CBC WITH DIFFERENTIAL/PLATELET - Abnormal; Notable for the following components:   WBC 3.3 (*)    Neutro Abs 1.6 (*)    All other components within normal limits  URINALYSIS, ROUTINE W REFLEX MICROSCOPIC - Abnormal; Notable for the following components:   APPearance CLOUDY (*)    Leukocytes, UA TRACE (*)    Bacteria, UA RARE (*)    Squamous Epithelial / LPF 6-30 (*)    All other components within normal limits  INFLUENZA PANEL BY PCR (TYPE A & B) - Abnormal; Notable for the following components:   Influenza A By PCR POSITIVE (*)    All other components within normal limits  I-STAT CG4 LACTIC ACID, ED  I-STAT BETA HCG BLOOD, ED ([facility], [facility], [facility] ONLY)  I-STAT CG4 LACTIC ACID, ED    EKG  EKG Interpretation None       Radiology Dg Chest 2 View  Result Date: 10/17/2017 CLINICAL DATA:  Cough and fever EXAM: CHEST  2 VIEW COMPARISON:  Chest radiograph 07/07/2014 FINDINGS: The heart size and mediastinal contours are within normal limits. Both lungs are clear. The visualized skeletal structures are unremarkable. IMPRESSION: No active cardiopulmonary disease. Electronically Signed   By: Deatra Robinson M.D.   On: 10/17/2017 22:06    Procedures Procedures (including critical care time)  Medications Ordered in ED Medications  sodium chloride 0.9 % bolus 1,000 mL (not administered)     Initial Impression / Assessment and Plan / ED Course  I have reviewed the triage vital signs and the nursing notes.  Pertinent labs & imaging results that were available during my care of the patient were reviewed by me and considered in my medical decision  making (see chart for details).    Patient will be given instructions for fluid hydration at home and she does not want IV fluids.  Patient also given an albuterol inhaler with a spacer told to return for any worsening in her condition.  Patient agrees the plan and all questions were answered.  Final Clinical Impressions(s) / ED Diagnoses   Final diagnoses:  None    ED Discharge Orders    None       Charlestine Night, PA-C 10/18/17 0111    Terrilee Files, MD 10/18/17 [phone]

## 2017-10-18 NOTE — ED Notes (Signed)
Pt departed in NAD, refused use of wheelchair.  

## 2017-10-18 NOTE — ED Notes (Signed)
IV unsuccessful at this time.

## 2017-10-18 NOTE — Discharge Instructions (Signed)
Return here as needed.  Increase your fluid intake as much as possible.  Rest as much as possible.  Follow-up with a primary doctor.

## 2017-10-18 NOTE — ED Notes (Signed)
ED Provider at bedside. 

## 2018-08-19 ENCOUNTER — Other Ambulatory Visit: Payer: Self-pay

## 2018-08-19 ENCOUNTER — Encounter (HOSPITAL_COMMUNITY): Payer: Self-pay

## 2018-08-19 ENCOUNTER — Emergency Department (HOSPITAL_COMMUNITY)
Admission: EM | Admit: 2018-08-19 | Discharge: 2018-08-19 | Disposition: A | Discharge status: Home / Self Care | Payer: Self-pay | Attending: Emergency Medicine | Admitting: Emergency Medicine

## 2018-08-19 DIAGNOSIS — Z79899 Other long term (current) drug therapy: Secondary | ICD-10-CM | POA: Insufficient documentation

## 2018-08-19 DIAGNOSIS — J45909 Unspecified asthma, uncomplicated: Secondary | ICD-10-CM | POA: Insufficient documentation

## 2018-08-19 DIAGNOSIS — Z87891 Personal history of nicotine dependence: Secondary | ICD-10-CM | POA: Insufficient documentation

## 2018-08-19 DIAGNOSIS — J069 Acute upper respiratory infection, unspecified: Secondary | ICD-10-CM

## 2018-08-19 DIAGNOSIS — R1013 Epigastric pain: Secondary | ICD-10-CM

## 2018-08-19 LAB — URINALYSIS, ROUTINE W REFLEX MICROSCOPIC
BACTERIA UA: NONE SEEN
BILIRUBIN URINE: NEGATIVE
Glucose, UA: NEGATIVE mg/dL
Hgb urine dipstick: NEGATIVE
Ketones, ur: 5 mg/dL — AB
Nitrite: NEGATIVE
PH: 7 (ref 5.0–8.0)
Protein, ur: NEGATIVE mg/dL
SPECIFIC GRAVITY, URINE: 1.013 (ref 1.005–1.030)

## 2018-08-19 LAB — COMPREHENSIVE METABOLIC PANEL
ALBUMIN: 4.1 g/dL (ref 3.5–5.0)
ALT: 24 U/L (ref 0–44)
ANION GAP: 8 (ref 5–15)
AST: 22 U/L (ref 15–41)
Alkaline Phosphatase: 47 U/L (ref 38–126)
BILIRUBIN TOTAL: 0.7 mg/dL (ref 0.3–1.2)
BUN: 6 mg/dL (ref 6–20)
CALCIUM: 8.9 mg/dL (ref 8.9–10.3)
CO2: 23 mmol/L (ref 22–32)
Chloride: 104 mmol/L (ref 98–111)
Creatinine, Ser: 0.73 mg/dL (ref 0.44–1.00)
Glucose, Bld: 87 mg/dL (ref 70–99)
POTASSIUM: 4 mmol/L (ref 3.5–5.1)
Sodium: 135 mmol/L (ref 135–145)
TOTAL PROTEIN: 7.8 g/dL (ref 6.5–8.1)

## 2018-08-19 LAB — CBC WITH DIFFERENTIAL/PLATELET
Abs Immature Granulocytes: 0.02 10*3/uL (ref 0.00–0.07)
BASOS PCT: 1 %
Basophils Absolute: 0 10*3/uL (ref 0.0–0.1)
EOS ABS: 0.1 10*3/uL (ref 0.0–0.5)
EOS PCT: 1 %
HEMATOCRIT: 43.5 % (ref 36.0–46.0)
Hemoglobin: 13.5 g/dL (ref 12.0–15.0)
IMMATURE GRANULOCYTES: 0 %
LYMPHS ABS: 1.7 10*3/uL (ref 0.7–4.0)
Lymphocytes Relative: 25 %
MCH: 29.2 pg (ref 26.0–34.0)
MCHC: 31 g/dL (ref 30.0–36.0)
MCV: 94 fL (ref 80.0–100.0)
Monocytes Absolute: 0.4 10*3/uL (ref 0.1–1.0)
Monocytes Relative: 6 %
NEUTROS PCT: 67 %
NRBC: 0 % (ref 0.0–0.2)
Neutro Abs: 4.7 10*3/uL (ref 1.7–7.7)
PLATELETS: 233 10*3/uL (ref 150–400)
RBC: 4.63 MIL/uL (ref 3.87–5.11)
RDW: 14 % (ref 11.5–15.5)
WBC: 6.9 10*3/uL (ref 4.0–10.5)

## 2018-08-19 LAB — LIPASE, BLOOD: LIPASE: 27 U/L (ref 11–51)

## 2018-08-19 LAB — I-STAT BETA HCG BLOOD, ED (MC, WL, AP ONLY)

## 2018-08-19 MED ORDER — ALUM & MAG HYDROXIDE-SIMETH 200-200-20 MG/5ML PO SUSP
30.0000 mL | Freq: Once | ORAL | Status: AC
  Administered 2018-08-19: 30 mL via ORAL
  Filled 2018-08-19: qty 30

## 2018-08-19 MED ORDER — DM-GUAIFENESIN ER 30-600 MG PO TB12: 1.0000 | ORAL_TABLET | Freq: Two times a day (BID) | ORAL | 0 refills | Status: DC

## 2018-08-19 MED ORDER — ALBUTEROL SULFATE HFA 108 (90 BASE) MCG/ACT IN AERS
1.0000 | INHALATION_SPRAY | Freq: Once | RESPIRATORY_TRACT | Status: AC
  Administered 2018-08-19: 2 via RESPIRATORY_TRACT
  Filled 2018-08-19: qty 6.7

## 2018-08-19 MED ORDER — ONDANSETRON 4 MG PO TBDP
8.0000 mg | ORAL_TABLET | Freq: Once | ORAL | Status: AC
Start: 2018-08-19 — End: 2018-08-19
  Administered 2018-08-19: 4 mg via ORAL
  Filled 2018-08-19: qty 2

## 2018-08-19 MED ORDER — ONDANSETRON 4 MG PO TBDP: 4.0000 mg | ORAL_TABLET | Freq: Three times a day (TID) | ORAL | 0 refills | Status: DC | PRN

## 2018-08-19 NOTE — ED Provider Notes (Signed)
[facility] EMERGENCY DEPARTMENT Provider Note   CSN: [id number] Arrival date & time: 08/19/18  1420     History   Chief Complaint Chief Complaint  Patient presents with  . Abdominal Pain    HPI Pamela Wong is a 22 y.o. female who presents with URI symptoms and abdominal pain. PMH significant for asthma. She states that her symptoms started last night. She reports subjective fever, headache, bilateral ear pain, runny nose, nasal congestion, sore throat, and a cough. She also has had SOB and wheezing. She ran out of her inhaler. Additionally she endorses epigastric and LUQ abdominal pain. She's had several episodes of vomiting. No constipation, diarrhea, urinary symptoms. No vaginal discharge or bleeding. LMP was the beginning of November.  HPI  Past Medical History:  Diagnosis Date  . Asthma     There are no active problems to display for this patient.   History reviewed. No pertinent surgical history.   OB History   No obstetric history on file.      Home Medications    Prior to Admission medications   Medication Sig Start Date End Date Taking? Authorizing Provider  acetaminophen (TYLENOL) 325 MG tablet Take 325 mg by mouth every 6 (six) hours as needed for moderate pain.    [provider]  acetaminophen-codeine 120-12 MG/5ML solution Take 10 mLs by mouth every 4 (four) hours as needed for moderate pain. 10/18/17   Lawyer, Cristal Deer, PA-C  albuterol (PROVENTIL HFA;VENTOLIN HFA) 108 (90 BASE) MCG/ACT inhaler Inhale 4 puffs into the lungs every 4 (four) hours as needed for wheezing or shortness of breath. 05/01/13   Niel Hummer, MD  bacitracin ointment Apply 1 application topically 2 (two) times daily. 12/12/16   Janne Napoleon, NP  cephALEXin (KEFLEX) 500 MG capsule Take 1 capsule (500 mg total) by mouth 4 (four) times daily. 07/26/17   Geoffery Lyons, MD  Guaifenesin 1200 MG TB12 Take 1 tablet (1,200 mg total) by mouth 2 (two) times  daily. 10/18/17   Lawyer, Cristal Deer, PA-C  HYDROcodone-acetaminophen (NORCO/VICODIN) 5-325 MG tablet Take 1 tablet by mouth every 4 (four) hours as needed for severe pain. 02/18/17   Cristina Gong, PA-C  ibuprofen (ADVIL,MOTRIN) 800 MG tablet Take 1 tablet (800 mg total) by mouth every 8 (eight) hours as needed. 10/18/17   Lawyer, Cristal Deer, PA-C  naproxen (NAPROSYN) 500 MG tablet Take 1 tablet (500 mg total) by mouth 2 (two) times daily. 12/12/16   Janne Napoleon, NP  predniSONE (DELTASONE) 10 MG tablet Take 6 tablets (60 mg total) by mouth daily. 06/05/16   Derwood Kaplan, MD    Family History No family history on file.  Social History Social History   Tobacco Use  . Smoking status: Former Smoker    Years: 10.00    Types: Cigarettes    Last attempt to quit: 01/2016    Years since quitting: 2.6  . Smokeless tobacco: Never Used  Substance Use Topics  . Alcohol use: No  . Drug use: No     Allergies   Patient has no known allergies.   Review of Systems Review of Systems  Constitutional: Positive for fever.  HENT: Positive for congestion, ear pain, sinus pain and sore throat.   Respiratory: Positive for cough, shortness of breath and wheezing.   Cardiovascular: Negative for chest pain.  Gastrointestinal: Positive for abdominal pain, nausea and vomiting. Negative for constipation and diarrhea.  Genitourinary: Negative for dysuria, flank pain, vaginal bleeding and vaginal  discharge.  All other systems reviewed and are negative.    Physical Exam Updated Vital Signs BP 131/89 (BP Location: Right Arm)   Pulse 74   Temp 98.1 F (36.7 C) (Oral)   Resp 16   Ht 5\' 5"  (1.651 m)   Wt 90.7 kg   LMP 07/08/2018 (Approximate)   SpO2 96%   BMI 33.28 kg/m   Physical Exam Vitals signs and nursing note reviewed.  Constitutional:      General: She is not in acute distress.    Appearance: She is well-developed. She is obese. She is not ill-appearing.     Comments: Calm and  cooperative. Sounds congested  HENT:     Head: Normocephalic and atraumatic.     Right Ear: Hearing, tympanic membrane, ear canal and external ear normal.     Left Ear: Hearing, tympanic membrane, ear canal and external ear normal.     Nose: Nose normal.     Mouth/Throat:     Mouth: Mucous membranes are moist.     Pharynx: Oropharynx is clear. Uvula midline.  Eyes:     General: No scleral icterus.       Right eye: No discharge.        Left eye: No discharge.     Conjunctiva/sclera: Conjunctivae normal.     Pupils: Pupils are equal, round, and reactive to light.  Neck:     Musculoskeletal: Normal range of motion.  Cardiovascular:     Rate and Rhythm: Normal rate and regular rhythm.     Heart sounds: No murmur. No friction rub. No gallop.   Pulmonary:     Effort: Pulmonary effort is normal. No respiratory distress.     Breath sounds: Normal breath sounds.  Abdominal:     General: There is no distension.     Palpations: Abdomen is soft.     Tenderness: There is abdominal tenderness (epigastric and LUQ).  Skin:    General: Skin is warm and dry.  Neurological:     Mental Status: She is alert and oriented to person, place, and time.  Psychiatric:        Behavior: Behavior normal.      ED Treatments / Results  Labs (all labs ordered are listed, but only abnormal results are displayed) Labs Reviewed  URINALYSIS, ROUTINE W REFLEX MICROSCOPIC - Abnormal; Notable for the following components:      Result Value   Ketones, ur 5 (*)    Leukocytes, UA SMALL (*)    All other components within normal limits  COMPREHENSIVE METABOLIC PANEL  LIPASE, BLOOD  CBC WITH DIFFERENTIAL/PLATELET  I-STAT BETA HCG BLOOD, ED ([facility], [facility], [facility] ONLY)    EKG None  Radiology No results found.  Procedures Procedures (including critical care time)  Medications Ordered in ED Medications  ondansetron (ZOFRAN-ODT) disintegrating tablet 8 mg (4 mg Oral Given 08/19/18 1449)  albuterol (PROVENTIL  HFA;VENTOLIN HFA) 108 (90 Base) MCG/ACT inhaler 1-2 puff (2 puffs Inhalation Given 08/19/18 1449)  alum & mag hydroxide-simeth (MAALOX/MYLANTA) 200-200-20 MG/5ML suspension 30 mL (30 mLs Oral Given 08/19/18 1529)     Initial Impression / Assessment and Plan / ED Course  I have reviewed the triage vital signs and the nursing notes.  Pertinent labs & imaging results that were available during my care of the patient were reviewed by me and considered in my medical decision making (see chart for details).  22 year old female presents with URI symptoms and epigastric/LUQ pain for the past 1-2  days. Vitals are normal. On exam she has findings consistent with URI and significant epigastric tenderness. Will obtain labs, UA, preg test and reassess.  Labs are normal. UA has 5 ketones and small leukocytes but no bacteria and minimal WBC. Discussed with patient. Will treat for URI and gastritis. Pt was given return precautions.  Final Clinical Impressions(s) / ED Diagnoses   Final diagnoses:  Upper respiratory tract infection, unspecified type  Epigastric pain    ED Discharge Orders    None       Bethel BornGekas, Chason Mciver Marie, PA-C 08/20/18 Arman [id number]    Cook, Brian, MD 08/20/18 [phone]

## 2018-08-19 NOTE — ED Triage Notes (Signed)
Pt reports LUQ pain since his morning with nausea. Pt also reports cold symptoms (sore throat, cough, congestion).

## 2018-08-19 NOTE — Discharge Instructions (Addendum)
Please rest and drink plenty of fluids Take Mucinex DM for cough Take Tylenol for fever/pain Take Zofran for nausea Return if worsening

## 2018-10-14 ENCOUNTER — Other Ambulatory Visit: Payer: Self-pay

## 2018-10-14 ENCOUNTER — Emergency Department (HOSPITAL_COMMUNITY)
Admission: EM | Admit: 2018-10-14 | Discharge: 2018-10-15 | Disposition: A | Discharge status: Home / Self Care | Payer: Self-pay | Attending: Emergency Medicine | Admitting: Emergency Medicine

## 2018-10-14 ENCOUNTER — Encounter (HOSPITAL_COMMUNITY): Payer: Self-pay | Admitting: *Deleted

## 2018-10-14 DIAGNOSIS — N739 Female pelvic inflammatory disease, unspecified: Secondary | ICD-10-CM | POA: Insufficient documentation

## 2018-10-14 DIAGNOSIS — J45909 Unspecified asthma, uncomplicated: Secondary | ICD-10-CM | POA: Insufficient documentation

## 2018-10-14 DIAGNOSIS — Z87891 Personal history of nicotine dependence: Secondary | ICD-10-CM | POA: Insufficient documentation

## 2018-10-14 DIAGNOSIS — Z79899 Other long term (current) drug therapy: Secondary | ICD-10-CM | POA: Insufficient documentation

## 2018-10-14 MED ORDER — SODIUM CHLORIDE 0.9% FLUSH: 3.0000 mL | Freq: Once | INTRAVENOUS | Status: DC

## 2018-10-14 NOTE — ED Triage Notes (Signed)
Pt reports sharp lower abd pain x 4 days with vaginal discharge. Denies urinary symptoms. Has nausea, no vomiting or diarrhea.

## 2018-10-15 LAB — WET PREP, GENITAL
Sperm: NONE SEEN
Trich, Wet Prep: NONE SEEN
Yeast Wet Prep HPF POC: NONE SEEN

## 2018-10-15 LAB — URINALYSIS, ROUTINE W REFLEX MICROSCOPIC
Bilirubin Urine: NEGATIVE
GLUCOSE, UA: NEGATIVE mg/dL
Hgb urine dipstick: NEGATIVE
Ketones, ur: NEGATIVE mg/dL
NITRITE: NEGATIVE
PH: 6 (ref 5.0–8.0)
Protein, ur: NEGATIVE mg/dL
Specific Gravity, Urine: 1.016 (ref 1.005–1.030)

## 2018-10-15 LAB — GC/CHLAMYDIA PROBE AMP (~~LOC~~) NOT AT ARMC
CHLAMYDIA, DNA PROBE: NEGATIVE
NEISSERIA GONORRHEA: POSITIVE — AB

## 2018-10-15 LAB — COMPREHENSIVE METABOLIC PANEL
ALT: 12 U/L (ref 0–44)
AST: 15 U/L (ref 15–41)
Albumin: 3.9 g/dL (ref 3.5–5.0)
Alkaline Phosphatase: 42 U/L (ref 38–126)
Anion gap: 6 (ref 5–15)
BUN: 6 mg/dL (ref 6–20)
CHLORIDE: 106 mmol/L (ref 98–111)
CO2: 25 mmol/L (ref 22–32)
CREATININE: 0.63 mg/dL (ref 0.44–1.00)
Calcium: 9 mg/dL (ref 8.9–10.3)
GFR calc non Af Amer: >60 mL/min (ref >60)
Glucose, Bld: 94 mg/dL (ref 70–99)
POTASSIUM: 4.1 mmol/L (ref 3.5–5.1)
SODIUM: 137 mmol/L (ref 135–145)
Total Bilirubin: 0.5 mg/dL (ref 0.3–1.2)
Total Protein: 7.8 g/dL (ref 6.5–8.1)

## 2018-10-15 LAB — I-STAT BETA HCG BLOOD, ED (MC, WL, AP ONLY): I-stat hCG, quantitative: <5 m[IU]/mL (ref <5)

## 2018-10-15 LAB — HIV ANTIBODY (ROUTINE TESTING W REFLEX): HIV SCREEN 4TH GENERATION: NONREACTIVE

## 2018-10-15 LAB — CBC
HCT: 39.8 % (ref 36.0–46.0)
Hemoglobin: 12.3 g/dL (ref 12.0–15.0)
MCH: 29.3 pg (ref 26.0–34.0)
MCHC: 30.9 g/dL (ref 30.0–36.0)
MCV: 94.8 fL (ref 80.0–100.0)
PLATELETS: 215 10*3/uL (ref 150–400)
RBC: 4.2 MIL/uL (ref 3.87–5.11)
RDW: 14.1 % (ref 11.5–15.5)
WBC: 12 10*3/uL — AB (ref 4.0–10.5)
nRBC: 0 % (ref 0.0–0.2)

## 2018-10-15 LAB — LIPASE, BLOOD: LIPASE: 24 U/L (ref 11–51)

## 2018-10-15 LAB — RPR: RPR Ser Ql: NONREACTIVE

## 2018-10-15 MED ORDER — HYDROCODONE-ACETAMINOPHEN 5-325 MG PO TABS: 1.0000 | ORAL_TABLET | ORAL | 0 refills | Status: DC | PRN

## 2018-10-15 MED ORDER — DOXYCYCLINE HYCLATE 100 MG PO CAPS: 100.0000 mg | ORAL_CAPSULE | Freq: Two times a day (BID) | ORAL | 0 refills | Status: DC

## 2018-10-15 MED ORDER — METRONIDAZOLE 500 MG PO TABS: 500.0000 mg | ORAL_TABLET | Freq: Two times a day (BID) | ORAL | 0 refills | Status: DC

## 2018-10-15 MED ORDER — CEFTRIAXONE SODIUM 250 MG IJ SOLR
250.0000 mg | Freq: Once | INTRAMUSCULAR | Status: AC
  Administered 2018-10-15: 250 mg via INTRAMUSCULAR
  Filled 2018-10-15: qty 250

## 2018-10-15 MED ORDER — METRONIDAZOLE 500 MG PO TABS
500.0000 mg | ORAL_TABLET | Freq: Once | ORAL | Status: AC
  Administered 2018-10-15: 500 mg via ORAL
  Filled 2018-10-15: qty 1

## 2018-10-15 MED ORDER — STERILE WATER FOR INJECTION IJ SOLN
INTRAMUSCULAR | Status: AC
  Filled 2018-10-15: qty 10

## 2018-10-15 MED ORDER — AZITHROMYCIN 250 MG PO TABS
1000.0000 mg | ORAL_TABLET | Freq: Once | ORAL | Status: AC
Start: 2018-10-15 — End: 2018-10-15
  Administered 2018-10-15: 1000 mg via ORAL
  Filled 2018-10-15: qty 4

## 2018-10-15 MED ORDER — OXYCODONE-ACETAMINOPHEN 5-325 MG PO TABS
1.0000 | ORAL_TABLET | Freq: Once | ORAL | Status: AC
  Administered 2018-10-15: 1 via ORAL
  Filled 2018-10-15: qty 1

## 2018-10-15 NOTE — ED Provider Notes (Signed)
[facility] Provider Note   CSN: [id number] Arrival date & time: 10/14/18  2309     History   Chief Complaint Chief Complaint  Patient presents with  . Abdominal Pain    HPI Pamela Wong is a 23 y.o. female.  The history is provided by the patient.  She has history of asthma and comes in with complaints of lower abdominal pain for the last 3 days.  Pain is severe and she rates it at 10/10.  Pain is worse with walking.  She denies fever, chills, sweats.  She denies nausea or vomiting.  She denies constipation or diarrhea.  She denies any urinary difficulty.  She has had a copious vaginal discharge over the last 2 days.  Last menses was January 21 and was normal.  She is not using any contraception.  Past Medical History:  Diagnosis Date  . Asthma     There are no active problems to display for this patient.   History reviewed. No pertinent surgical history.   OB History   No obstetric history on file.      Home Medications    Prior to Admission medications   Medication Sig Start Date End Date Taking? Authorizing Provider  acetaminophen (TYLENOL) 325 MG tablet Take 325 mg by mouth every 6 (six) hours as needed for moderate pain.    [provider]  acetaminophen-codeine 120-12 MG/5ML solution Take 10 mLs by mouth every 4 (four) hours as needed for moderate pain. 10/18/17   Lawyer, Cristal Deerhristopher, PA-C  albuterol (PROVENTIL HFA;VENTOLIN HFA) 108 (90 BASE) MCG/ACT inhaler Inhale 4 puffs into the lungs every 4 (four) hours as needed for wheezing or shortness of breath. 05/01/13   Niel HummerKuhner, Ross, MD  bacitracin ointment Apply 1 application topically 2 (two) times daily. 12/12/16   Janne NapoleonNeese, Hope M, NP  cephALEXin (KEFLEX) 500 MG capsule Take 1 capsule (500 mg total) by mouth 4 (four) times daily. 07/26/17   Geoffery Lyonselo, Douglas, MD  dextromethorphan-guaiFENesin (MUCINEX DM) 30-600 MG 12hr tablet Take 1 tablet by mouth 2 (two) times daily.  08/19/18   Bethel BornGekas, Kelly Marie, PA-C  Guaifenesin 1200 MG TB12 Take 1 tablet (1,200 mg total) by mouth 2 (two) times daily. 10/18/17   Lawyer, Cristal Deerhristopher, PA-C  HYDROcodone-acetaminophen (NORCO/VICODIN) 5-325 MG tablet Take 1 tablet by mouth every 4 (four) hours as needed for severe pain. 02/18/17   Cristina GongHammond, Elizabeth W, PA-C  ibuprofen (ADVIL,MOTRIN) 800 MG tablet Take 1 tablet (800 mg total) by mouth every 8 (eight) hours as needed. 10/18/17   Lawyer, Cristal Deerhristopher, PA-C  naproxen (NAPROSYN) 500 MG tablet Take 1 tablet (500 mg total) by mouth 2 (two) times daily. 12/12/16   Janne NapoleonNeese, Hope M, NP  ondansetron (ZOFRAN ODT) 4 MG disintegrating tablet Take 1 tablet (4 mg total) by mouth every 8 (eight) hours as needed for nausea or vomiting. 08/19/18   Bethel BornGekas, Kelly Marie, PA-C  predniSONE (DELTASONE) 10 MG tablet Take 6 tablets (60 mg total) by mouth daily. 06/05/16   Derwood KaplanNanavati, Ankit, MD    Family History History reviewed. No pertinent family history.  Social History Social History   Tobacco Use  . Smoking status: Former Smoker    Years: 10.00    Types: Cigarettes    Last attempt to quit: 01/2016    Years since quitting: 2.7  . Smokeless tobacco: Never Used  Substance Use Topics  . Alcohol use: No  . Drug use: No     Allergies  Patient has no known allergies.   Review of Systems Review of Systems  All other systems reviewed and are negative.    Physical Exam Updated Vital Signs BP 137/73 (BP Location: Right Arm)   Pulse 78   Temp 98.5 F (36.9 C) (Oral)   Resp 17   LMP 09/23/2018   SpO2 99%   Physical Exam Vitals signs and nursing note reviewed.    23 year old female, resting comfortably and in no acute distress. Vital signs are normal. Oxygen saturation is 99%, which is normal. Head is normocephalic and atraumatic. PERRLA, EOMI. Oropharynx is clear. Neck is nontender and supple without adenopathy or JVD. Back is nontender and there is no CVA tenderness. Lungs are clear  without rales, wheezes, or rhonchi. Chest is nontender. Heart has regular rate and rhythm without murmur. Abdomen is soft, flat, with moderate tenderness diffusely. There is no rebound or guarding. There are no masses or hepatosplenomegaly and peristalsis is normoactive. Pelvic: Normal external female genitalia.  Cervix is closed.  There is moderate amount of foamy, watery, green discharge present.  There is marked tenderness diffusely including severe cervical motion tenderness.  Unable to establish uterine size or whether adnexal masses are present because of degree of tenderness. Extremities have no cyanosis or edema, full range of motion is present. Skin is warm and dry without rash. Neurologic: Mental status is normal, cranial nerves are intact, there are no motor or sensory deficits.  ED Treatments / Results  Labs (all labs ordered are listed, but only abnormal results are displayed) Labs Reviewed  CBC - Abnormal; Notable for the following components:      Result Value   WBC 12.0 (*)    All other components within normal limits  URINALYSIS, ROUTINE W REFLEX MICROSCOPIC - Abnormal; Notable for the following components:   APPearance CLOUDY (*)    Leukocytes, UA LARGE (*)    Bacteria, UA RARE (*)    All other components within normal limits  LIPASE, BLOOD  COMPREHENSIVE METABOLIC PANEL  I-STAT BETA HCG BLOOD, ED ([facility], [facility], [facility] ONLY)   Procedures Procedures  Medications Ordered in ED Medications  sodium chloride flush (NS) 0.9 % injection 3 mL (has no administration in time range)  sterile water (preservative free) injection (has no administration in time range)  cefTRIAXone (ROCEPHIN) injection 250 mg (250 mg Intramuscular Given 10/15/18 0603)  azithromycin (ZITHROMAX) tablet 1,000 mg (1,000 mg Oral Given 10/15/18 0603)  metroNIDAZOLE (FLAGYL) tablet 500 mg (500 mg Oral Given 10/15/18 0603)  oxyCODONE-acetaminophen (PERCOCET/ROXICET) 5-325 MG per tablet 1 tablet (1 tablet Oral Given  10/15/18 0603)   Initial Impression / Assessment and Plan / ED Course  I have reviewed the triage vital signs and the nursing notes.  Pertinent labs & imaging results that were available during my care of the patient were reviewed by me and considered in my medical decision making (see chart for details).  Abdominal pain which seems to be pelvic inflammatory disease.  Routine cultures are sent.  Labs are unremarkable with mild leukocytosis of 12.0.  Urinalysis does show some pyuria, but also has significant number of squamous epithelial cells.  She is not pregnant.  She is given an injection of ceftriaxone and given oral azithromycin and metronidazole.  She will be discharged with prescriptions for doxycycline and metronidazole [facility].  Wet prep is positive for clue cells, negative for trichomonas.  She is discharged with above-noted prescriptions, also prescription for small number  of hydrocodone-acetaminophen.  Advised use over-the-counter NSAIDs for additional pain relief.  Final Clinical Impressions(s) / ED Diagnoses   Final diagnoses:  Pelvic inflammatory disease    ED Discharge Orders         Ordered    doxycycline (VIBRAMYCIN) 100 MG capsule  2 times daily     10/15/18 0639    metroNIDAZOLE (FLAGYL) 500 MG tablet  2 times daily     10/15/18 0639    HYDROcodone-acetaminophen (NORCO/VICODIN) 5-325 MG tablet  Every 4 hours PRN     10/15/18 1638           Dione Booze, MD 10/15/18 [phone]

## 2018-10-15 NOTE — ED Notes (Signed)
Patient verbalizes understanding of medications and discharge instructions. No further questions at this time. VSS and patient ambulatory at discharge.   

## 2018-10-15 NOTE — Discharge Instructions (Signed)
Take ibuprofen or naproxen for additional pain relief.  Return if symptoms are getting worse.

## 2019-05-25 ENCOUNTER — Ambulatory Visit (HOSPITAL_COMMUNITY)
Admission: EM | Admit: 2019-05-25 | Discharge: 2019-05-25 | Disposition: A | Discharge status: Home / Self Care | Payer: Self-pay | Attending: Family Medicine | Admitting: Family Medicine

## 2019-05-25 ENCOUNTER — Other Ambulatory Visit: Payer: Self-pay

## 2019-05-25 ENCOUNTER — Ambulatory Visit (INDEPENDENT_AMBULATORY_CARE_PROVIDER_SITE_OTHER): Payer: Self-pay

## 2019-05-25 ENCOUNTER — Encounter (HOSPITAL_COMMUNITY): Payer: Self-pay | Admitting: Emergency Medicine

## 2019-05-25 DIAGNOSIS — S62667A Nondisplaced fracture of distal phalanx of left little finger, initial encounter for closed fracture: Secondary | ICD-10-CM

## 2019-05-25 NOTE — Discharge Instructions (Signed)
You have a fracture of the pinky finger We will place in splint and have you follow up with orthopedics Rest, Ice the hand and wear the splint.  Ibuprofen for pain
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
## 2019-09-02 ENCOUNTER — Ambulatory Visit (INDEPENDENT_AMBULATORY_CARE_PROVIDER_SITE_OTHER): Payer: Self-pay | Admitting: Primary Care

## 2019-09-02 ENCOUNTER — Other Ambulatory Visit: Payer: Self-pay

## 2019-09-02 ENCOUNTER — Encounter (INDEPENDENT_AMBULATORY_CARE_PROVIDER_SITE_OTHER): Payer: Self-pay | Admitting: Primary Care

## 2019-09-02 DIAGNOSIS — Z7689 Persons encountering health services in other specified circumstances: Secondary | ICD-10-CM

## 2019-09-02 DIAGNOSIS — J45909 Unspecified asthma, uncomplicated: Secondary | ICD-10-CM

## 2019-09-02 MED ORDER — ALBUTEROL SULFATE HFA 108 (90 BASE) MCG/ACT IN AERS: 2.0000 | INHALATION_SPRAY | Freq: Four times a day (QID) | RESPIRATORY_TRACT | 1 refills | Status: DC | PRN

## 2019-09-02 MED ORDER — ALBUTEROL SULFATE HFA 108 (90 BASE) MCG/ACT IN AERS: 4.0000 | INHALATION_SPRAY | RESPIRATORY_TRACT | 1 refills | Status: DC | PRN

## 2019-09-02 NOTE — Progress Notes (Signed)
Virtual Visit via Telephone Note  I connected with Pamela Wong on 09/02/19 at  3:30 PM EST by telephone and verified that I am speaking with the correct person using two identifiers.   I discussed the limitations, risks, security and privacy concerns of performing an evaluation and management service by telephone and the availability of in person appointments. I also discussed with the patient that there may be a patient responsible charge related to this service. The patient expressed understanding and agreed to proceed.   History of Present Illness: Pamela Wong is having a tele visit to establish care we discussed [facility] we agreed to look into adopting a puppy. She will make an appointment to have a pap.   Past Medical History:  Diagnosis Date  . Asthma      Current Outpatient Medications on File Prior to Visit  Medication Sig Dispense Refill  . albuterol (PROVENTIL HFA;VENTOLIN HFA) 108 (90 BASE) MCG/ACT inhaler Inhale 4 puffs into the lungs every 4 (four) hours as needed for wheezing or shortness of breath. 1 Inhaler 2   No current facility-administered medications on file prior to visit.   Observations/Objective: Review of Systems  Respiratory: Positive for wheezing.        Asthma  All other systems reviewed and are negative.   Assessment and Plan: Saga was seen today for new patient (initial visit).  Diagnoses and all orders for this visit:  Encounter to establish care Juluis Mire, NP-C will be your  (PCP) mastered prepared that is able to that will  diagnosed and treatment able to answer health concern as well as continuing care of varied medical conditions, not limited by cause, organ system, or diagnosis.   Mild asthma without complication, unspecified whether persistent She does have symptoms which include cough, wheeze, and shortness of breath managed by SABA (albuterol HFA)   Other orders -     albuterol (VENTOLIN HFA) 108 (90 Base)  MCG/ACT inhaler; Inhale 2 puffs into the lungs every 4 (four) hours as needed for wheezing or shortness of breath.   Follow Up Instructions:    I discussed the assessment and treatment plan with the patient. The patient was provided an opportunity to ask questions and all were answered. The patient agreed with the plan and demonstrated an understanding of the instructions.   The patient was advised to call back or seek an in-person evaluation if the symptoms worsen or if the condition fails to improve as anticipated.  I provided 14 minutes of non-face-to-face time during this encounter.   Kerin Perna, NP

## 2019-09-16 ENCOUNTER — Other Ambulatory Visit: Payer: Self-pay

## 2019-09-16 ENCOUNTER — Ambulatory Visit (INDEPENDENT_AMBULATORY_CARE_PROVIDER_SITE_OTHER): Payer: Self-pay | Admitting: Primary Care

## 2019-09-16 ENCOUNTER — Encounter (INDEPENDENT_AMBULATORY_CARE_PROVIDER_SITE_OTHER): Payer: Self-pay | Admitting: Primary Care

## 2019-09-16 ENCOUNTER — Other Ambulatory Visit (HOSPITAL_COMMUNITY)
Admission: RE | Admit: 2019-09-16 | Discharge: 2019-09-16 | Disposition: A | Discharge status: Home / Self Care | Payer: Medicaid Other | Source: Ambulatory Visit | Attending: Primary Care | Admitting: Primary Care

## 2019-09-16 ENCOUNTER — Other Ambulatory Visit (INDEPENDENT_AMBULATORY_CARE_PROVIDER_SITE_OTHER): Payer: Self-pay | Admitting: Primary Care

## 2019-09-16 VITALS — BP 115/72 | HR 70 | Temp 97.3°F | Ht 67.32 in | Wt 218.2 lb

## 2019-09-16 DIAGNOSIS — Z124 Encounter for screening for malignant neoplasm of cervix: Secondary | ICD-10-CM | POA: Insufficient documentation

## 2019-09-16 DIAGNOSIS — Z7251 High risk heterosexual behavior: Secondary | ICD-10-CM

## 2019-09-16 DIAGNOSIS — Z3202 Encounter for pregnancy test, result negative: Secondary | ICD-10-CM

## 2019-09-16 DIAGNOSIS — A64 Unspecified sexually transmitted disease: Secondary | ICD-10-CM | POA: Insufficient documentation

## 2019-09-16 DIAGNOSIS — Z01419 Encounter for gynecological examination (general) (routine) without abnormal findings: Secondary | ICD-10-CM

## 2019-09-16 LAB — POCT URINE PREGNANCY: Preg Test, Ur: NEGATIVE

## 2019-09-16 MED ORDER — FLUCONAZOLE 150 MG PO TABS: 150.0000 mg | ORAL_TABLET | Freq: Once | ORAL | 0 refills | Status: AC

## 2019-09-16 MED ORDER — METRONIDAZOLE 500 MG PO TABS: 500.0000 mg | ORAL_TABLET | Freq: Two times a day (BID) | ORAL | 0 refills | Status: DC

## 2019-09-16 NOTE — Patient Instructions (Signed)
The USPSTF recommendations screening of cervical cancer every 3 years with cervical cytology.  All women age 24 to 65 years are at risk for cervical cancer because of potential exposure to high risk HPV types to sexual intercourse and should be screened.  Certainly risk factors further increased risk for cervical cancer including HIV infection, a compromised immune system, and utero exposure to diethylstilbestrol and previous treatment of high-grade precancerous lesions or cervical cancer.  Women with these risk factors should receive individual follow-up 

## 2019-09-16 NOTE — Progress Notes (Addendum)
Established Patient Office Visit  Subjective:  Patient ID: Pamela Wong, female    DOB: Nov 27, 1995  Age: 24 y.o. MRN: [id number]  CC:  Chief Complaint  Patient presents with  . Annual Exam  . Gynecologic Exam    HPI  Pamela Wong presents for well woman physical gyn exam and is sexually active without protection requesting a pregnancy test. LMP 1/3-05/2020  Past Medical History:  Diagnosis Date  . Asthma     No past surgical history on file.  No family history on file.  Social History   Socioeconomic History  . Marital status: Single    Spouse name: Not on file  . Number of children: Not on file  . Years of education: Not on file  . Highest education level: Not on file  Occupational History  . Not on file  Tobacco Use  . Smoking status: Former Smoker    Years: 10.00    Types: Cigarettes    Quit date: 01/2016    Years since quitting: 3.7  . Smokeless tobacco: Never Used  Substance and Sexual Activity  . Alcohol use: No  . Drug use: No  . Sexual activity: Yes    Birth control/protection: None  Other Topics Concern  . Not on file  Social History Narrative  . Not on file   Social Determinants of Health   Financial Resource Strain:   . Difficulty of Paying Living Expenses: Not on file  Food Insecurity:   . Worried About Programme researcher, broadcasting/film/video in the Last Year: Not on file  . Ran Out of Food in the Last Year: Not on file  Transportation Needs:   . Lack of Transportation (Medical): Not on file  . Lack of Transportation (Non-Medical): Not on file  Physical Activity:   . Days of Exercise per Week: Not on file  . Minutes of Exercise per Session: Not on file  Stress:   . Feeling of Stress : Not on file  Social Connections:   . Frequency of Communication with Friends and Family: Not on file  . Frequency of Social Gatherings with Friends and Family: Not on file  . Attends Religious Services: Not on file  . Active Member of Clubs or Organizations: Not  on file  . Attends Banker Meetings: Not on file  . Marital Status: Not on file  Intimate Partner Violence:   . Fear of Current or Ex-Partner: Not on file  . Emotionally Abused: Not on file  . Physically Abused: Not on file  . Sexually Abused: Not on file    Outpatient Medications Prior to Visit  Medication Sig Dispense Refill  . albuterol (VENTOLIN HFA) 108 (90 Base) MCG/ACT inhaler Inhale 2 puffs into the lungs every 6 (six) hours as needed for wheezing or shortness of breath. 6.7 g 1   No facility-administered medications prior to visit.    No Known Allergies  ROS Review of Systems  All other systems reviewed and are negative.     Objective:    Physical Exam CONSTITUTIONAL: Well-developed, well-nourished female in no acute distress.  HENT:  Normocephalic, atraumatic, External right and left ear normal. EYES: Conjunctivae and EOM are normal. Pupils are equal, round, and reactive to light. No scleral icterus.  NECK: Normal range of motion, supple, no masses.  Normal thyroid.  SKIN: Skin is warm and dry. No rash noted. Not diaphoretic. No erythema. No pallor. NEUROLGIC: Alert and oriented to person, place, and time. Normal reflexes, muscle  tone coordination. No cranial nerve deficit noted. PSYCHIATRIC: Normal mood and affect. Normal behavior. Normal judgment and thought content. CARDIOVASCULAR: Normal heart rate noted, regular rhythm RESPIRATORY: Clear to auscultation bilaterally. Effort and breath sounds normal, no problems with respiration noted. BREASTS: Taught SBE ABDOMEN: Soft, normal bowel sounds, no distention noted.  No tenderness, rebound or guarding.  PELVIC: Normal appearing external genitalia; normal appearing vaginal mucosa and cervix.  abnormal discharge and odor noted.  Pap smear obtained.  Normal uterine size, no other palpable masses, no uterine or adnexal tenderness. MUSCULOSKELETAL: Normal range of motion. No tenderness.  No cyanosis, clubbing,  or edema.  2+ distal pulses. BP 115/72 (BP Location: Right Arm, Patient Position: Sitting, Cuff Size: Large)   Pulse 70   Temp (!) 97.3 F (36.3 C) (Temporal)   Ht 5' 7.32" (1.71 m)   Wt 218 lb 3.2 oz (99 kg)   LMP 09/10/2019 (Exact Date)   SpO2 99%   BMI 33.85 kg/m  Wt Readings from Last 3 Encounters:  09/16/19 218 lb 3.2 oz (99 kg)  08/19/18 200 lb (90.7 kg)  10/17/17 189 lb (85.7 kg)     Health Maintenance Due  Topic Date Due  . PAP-Cervical Cytology Screening  12/17/2016  . PAP SMEAR-Modifier  12/17/2016    There are no preventive care reminders to display for this patient.  No results found for: TSH Lab Results  Component Value Date   WBC 12.0 (H) 10/15/2018   HGB 12.3 10/15/2018   HCT 39.8 10/15/2018   MCV 94.8 10/15/2018   PLT 215 10/15/2018   Lab Results  Component Value Date   NA 137 10/15/2018   K 4.1 10/15/2018   CO2 25 10/15/2018   GLUCOSE 94 10/15/2018   BUN 6 10/15/2018   CREATININE 0.63 10/15/2018   BILITOT 0.5 10/15/2018   ALKPHOS 42 10/15/2018   AST 15 10/15/2018   ALT 12 10/15/2018   PROT 7.8 10/15/2018   ALBUMIN 3.9 10/15/2018   CALCIUM 9.0 10/15/2018   ANIONGAP 6 10/15/2018   No results found for: CHOL No results found for: HDL No results found for: LDLCALC No results found for: TRIG No results found for: CHOLHDL No results found for: HGBA1C    Assessment & Plan:  Pamela Wong was seen today for annual exam and gynecologic exam.  Diagnoses and all orders for this visit:  Cervical cancer screening Completed screening recommended every 3 years unless abnormal findings -     Cytology - PAP([place])  Sexually transmitted disease Pt counseled regarding condom use with each sexual activity to promote wellness and prevention of transmission of HIV, syphilis, herpes simplex virus, gonorrhea, chlamydia and trichomoniasis..   -     Cervicovaginal ancillary only  Unprotected sex Prior to menstrual cycle and afterwards -     POCT  urine pregnancy -negative   No orders of the defined types were placed in this encounter.   Follow-up: Return if symptoms worsen or fail to improve.    Kerin Perna, NP

## 2019-09-18 LAB — CYTOLOGY - PAP
Adequacy: ABSENT
Diagnosis: NEGATIVE

## 2019-09-18 LAB — CERVICOVAGINAL ANCILLARY ONLY
Bacterial Vaginitis (gardnerella): POSITIVE — AB
Candida Glabrata: NEGATIVE
Candida Vaginitis: NEGATIVE
Chlamydia: NEGATIVE
Comment: NEGATIVE
Comment: NEGATIVE
Comment: NEGATIVE
Comment: NEGATIVE
Comment: NEGATIVE
Comment: NORMAL
Neisseria Gonorrhea: NEGATIVE
Trichomonas: NEGATIVE

## 2019-11-24 ENCOUNTER — Ambulatory Visit (HOSPITAL_COMMUNITY)
Admission: EM | Admit: 2019-11-24 | Discharge: 2019-11-24 | Disposition: A | Discharge status: Home / Self Care | Payer: Medicaid Other | Attending: Family Medicine | Admitting: Family Medicine

## 2019-11-24 ENCOUNTER — Other Ambulatory Visit: Payer: Self-pay

## 2019-11-24 ENCOUNTER — Encounter (HOSPITAL_COMMUNITY): Payer: Self-pay | Admitting: Emergency Medicine

## 2019-11-24 DIAGNOSIS — Z20822 Contact with and (suspected) exposure to covid-19: Secondary | ICD-10-CM | POA: Insufficient documentation

## 2019-11-24 DIAGNOSIS — R0981 Nasal congestion: Secondary | ICD-10-CM | POA: Diagnosis not present

## 2019-11-24 DIAGNOSIS — J4521 Mild intermittent asthma with (acute) exacerbation: Secondary | ICD-10-CM | POA: Insufficient documentation

## 2019-11-24 DIAGNOSIS — Z87891 Personal history of nicotine dependence: Secondary | ICD-10-CM | POA: Insufficient documentation

## 2019-11-24 MED ORDER — ALBUTEROL SULFATE HFA 108 (90 BASE) MCG/ACT IN AERS: 2.0000 | INHALATION_SPRAY | Freq: Four times a day (QID) | RESPIRATORY_TRACT | 11 refills | Status: DC | PRN

## 2019-11-24 MED ORDER — PREDNISONE 20 MG PO TABS: ORAL_TABLET | ORAL | 1 refills | Status: DC

## 2019-11-24 NOTE — ED Provider Notes (Signed)
[facility]    CSN: [id number] Arrival date & time: 11/24/19  0957      History   Chief Complaint Chief Complaint  Patient presents with  . Asthma  . Nasal Congestion    HPI Pamela Wong is a 24 y.o. female.   Established [facility] patient here for asthma flare-up.  Pt  wheezing and asthma sx x 2 days; pt sts she is out of her inhaler; pt sts some nasal congestion.  Did not sleep well last night.  Patient's had no fever, shortness of breath, loss of sense of smell, or diarrhea.  She works at [employer] [place].       Past Medical History:  Diagnosis Date  . Asthma     There are no problems to display for this patient.   History reviewed. No pertinent surgical history.  OB History   No obstetric history on file.      Home Medications    Prior to Admission medications   Medication Sig Start Date End Date Taking? Authorizing Provider  albuterol (VENTOLIN HFA) 108 (90 Base) MCG/ACT inhaler Inhale 2 puffs into the lungs every 6 (six) hours as needed for wheezing or shortness of breath. 11/24/19   Robyn Haber, MD  predniSONE (DELTASONE) 20 MG tablet one daily with food 11/24/19   Robyn Haber, MD    Family History Family History  Problem Relation Age of Onset  . Healthy Mother     Social History Social History   Tobacco Use  . Smoking status: Former Smoker    Years: 10.00    Types: Cigarettes    Quit date: 01/2016    Years since quitting: 3.8  . Smokeless tobacco: Never Used  Substance Use Topics  . Alcohol use: No  . Drug use: No     Allergies   Patient has no known allergies.   Review of Systems Review of Systems  HENT: Positive for congestion.   Respiratory: Positive for wheezing.   Neurological: Positive for headaches.  All other systems reviewed and are negative.    Physical Exam Triage Vital Signs ED Triage Vitals [11/24/19 1029]  Enc Vitals Group     BP (!) 140/102     Pulse Rate 94     Resp 18   Temp 99.1 F (37.3 C)     Temp Source Oral     SpO2 99 %     Weight      Height      Head Circumference      Peak Flow      Pain Score 5     Pain Loc      Pain Edu?      Excl. in [place]?    No data found.  Updated Vital Signs BP (!) 140/102 (BP Location: Left Arm)   Pulse 94   Temp 99.1 F (37.3 C) (Oral)   Resp 18   SpO2 99%    Physical Exam Vitals and nursing note reviewed.  Constitutional:      General: She is not in acute distress.    Appearance: Normal appearance. She is obese. She is not ill-appearing.  Eyes:     Conjunctiva/sclera: Conjunctivae normal.  Pulmonary:     Effort: Pulmonary effort is normal.     Breath sounds: Wheezing present.  Musculoskeletal:        General: Normal range of motion.     Cervical back: Normal range of motion and neck supple.  Skin:  General: Skin is warm and dry.  Neurological:     General: No focal deficit present.     Mental Status: She is alert and oriented to person, place, and time.  Psychiatric:        Mood and Affect: Mood normal.        Thought Content: Thought content normal.        Judgment: Judgment normal.      UC Treatments / Results  Labs (all labs ordered are listed, but only abnormal results are displayed) Labs Reviewed  SARS CORONAVIRUS 2 (TAT 6-24 HRS)    EKG   Radiology No results found.  Procedures Procedures (including critical care time)  Medications Ordered in UC Medications - No data to display  Initial Impression / Assessment and Plan / UC Course  I have reviewed the triage vital signs and the nursing notes.  Pertinent labs & imaging results that were available during my care of the patient were reviewed by me and considered in my medical decision making (see chart for details).    Final Clinical Impressions(s) / UC Diagnoses   Final diagnoses:  Mild intermittent asthma with acute exacerbation     Discharge Instructions     Your blood pressure is mildly elevated today.   Watch your salt consumption and recheck the pressure once the asthma is cleared.  Covid test will be available tomorrow.    ED Prescriptions    Medication Sig Dispense Auth. Provider   albuterol (VENTOLIN HFA) 108 (90 Base) MCG/ACT inhaler Inhale 2 puffs into the lungs every 6 (six) hours as needed for wheezing or shortness of breath. 6.7 g Elvina Sidle, MD   predniSONE (DELTASONE) 20 MG tablet one daily with food 3 tablet Elvina Sidle, MD     I have reviewed the PDMP during this encounter.   Elvina Sidle, MD 11/24/19 1041

## 2019-11-24 NOTE — Discharge Instructions (Signed)
Your blood pressure is mildly elevated today.  Watch your salt consumption and recheck the pressure once the asthma is cleared.  Covid test will be available tomorrow.

## 2019-11-24 NOTE — ED Triage Notes (Signed)
Pt here for wheezing and asthma sx x 2 days; pt sts she is out of her inhaler; pt sts some nasal congestion

## 2019-11-25 LAB — SARS CORONAVIRUS 2 (TAT 6-24 HRS): SARS Coronavirus 2: NEGATIVE

## 2019-12-16 IMAGING — DX DG CHEST 2V
2 series · 2 of 2 positions shown · non-contrast
Comparison: Chest radiograph 07/07/2014

CLINICAL DATA: Cough and fever

EXAM:
CHEST  2 VIEW

[w chest pa]
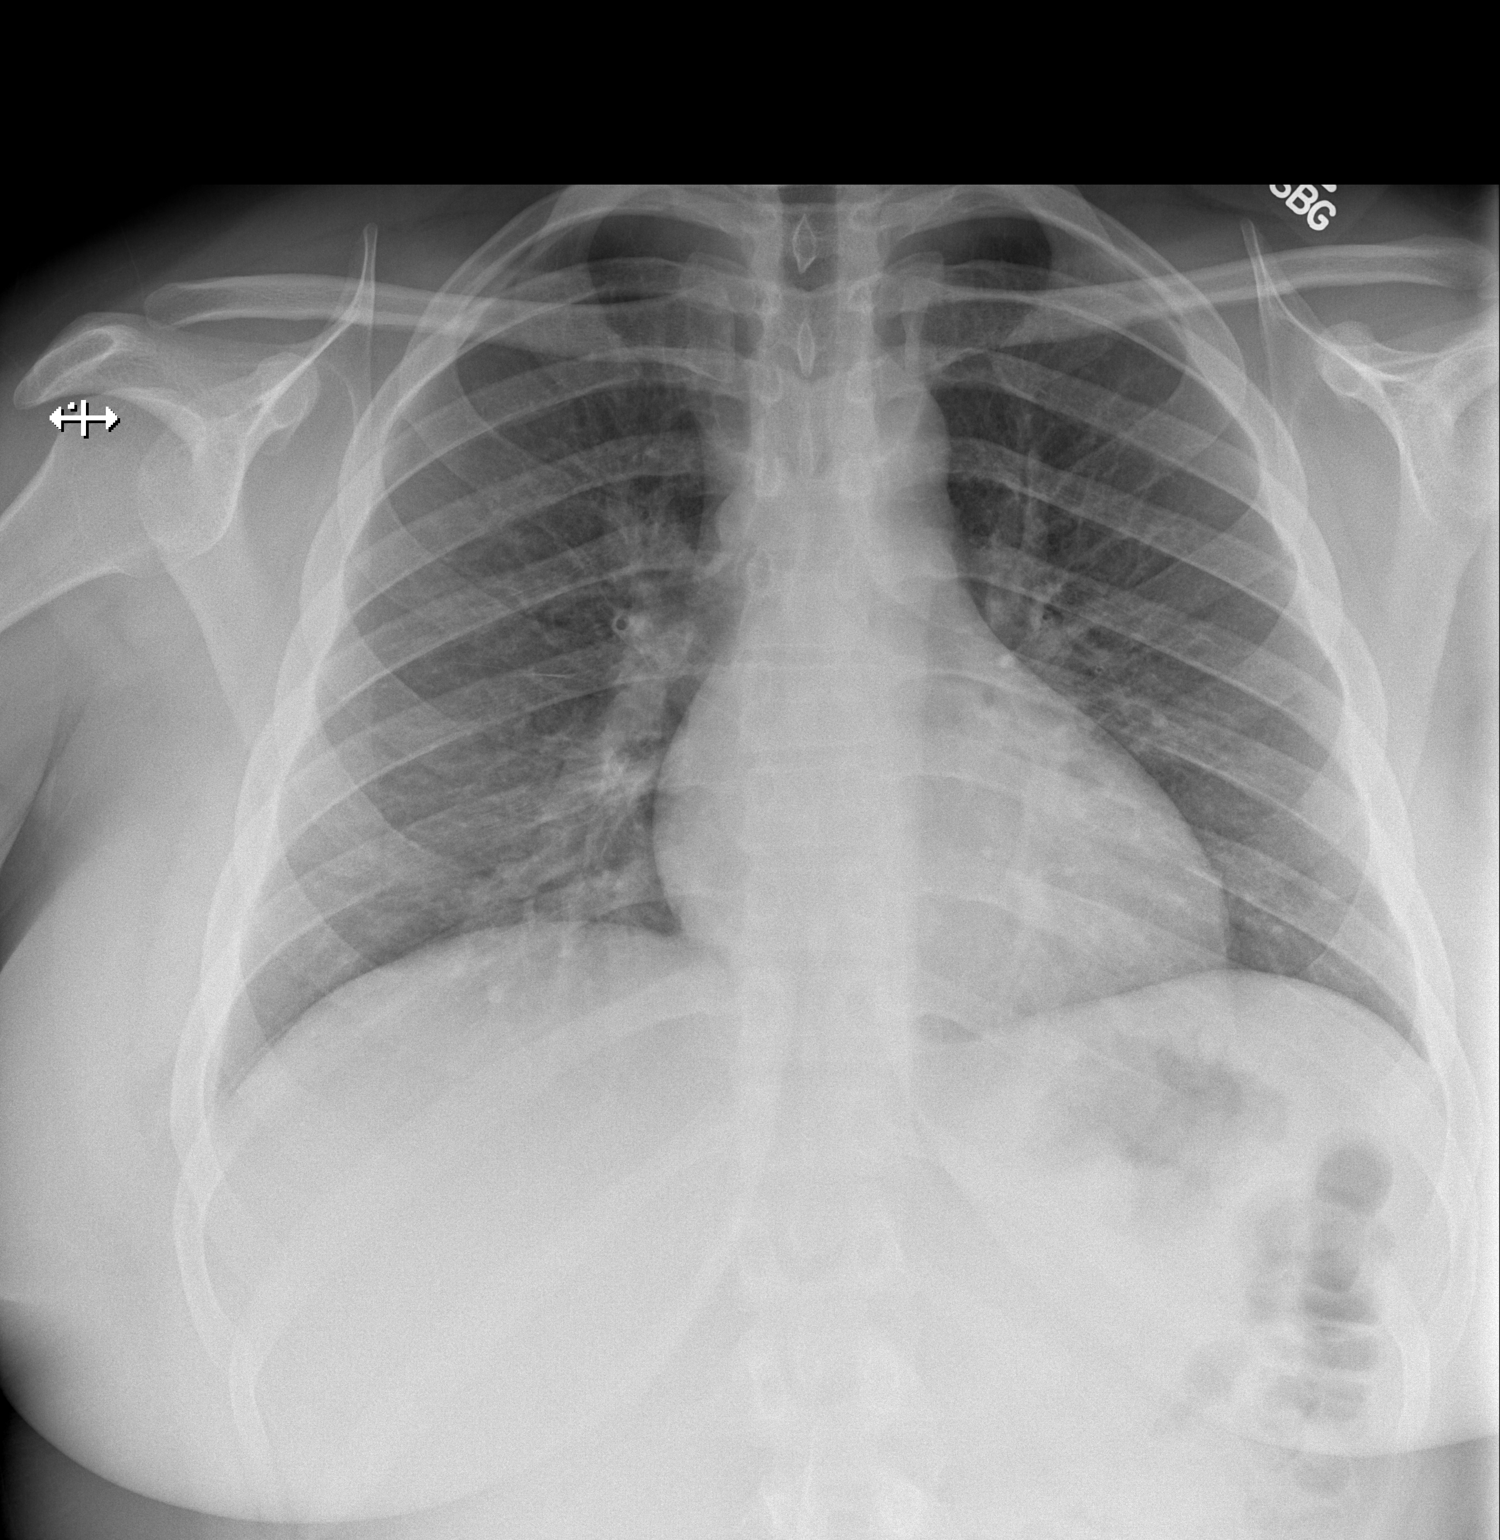

[w chest lat]
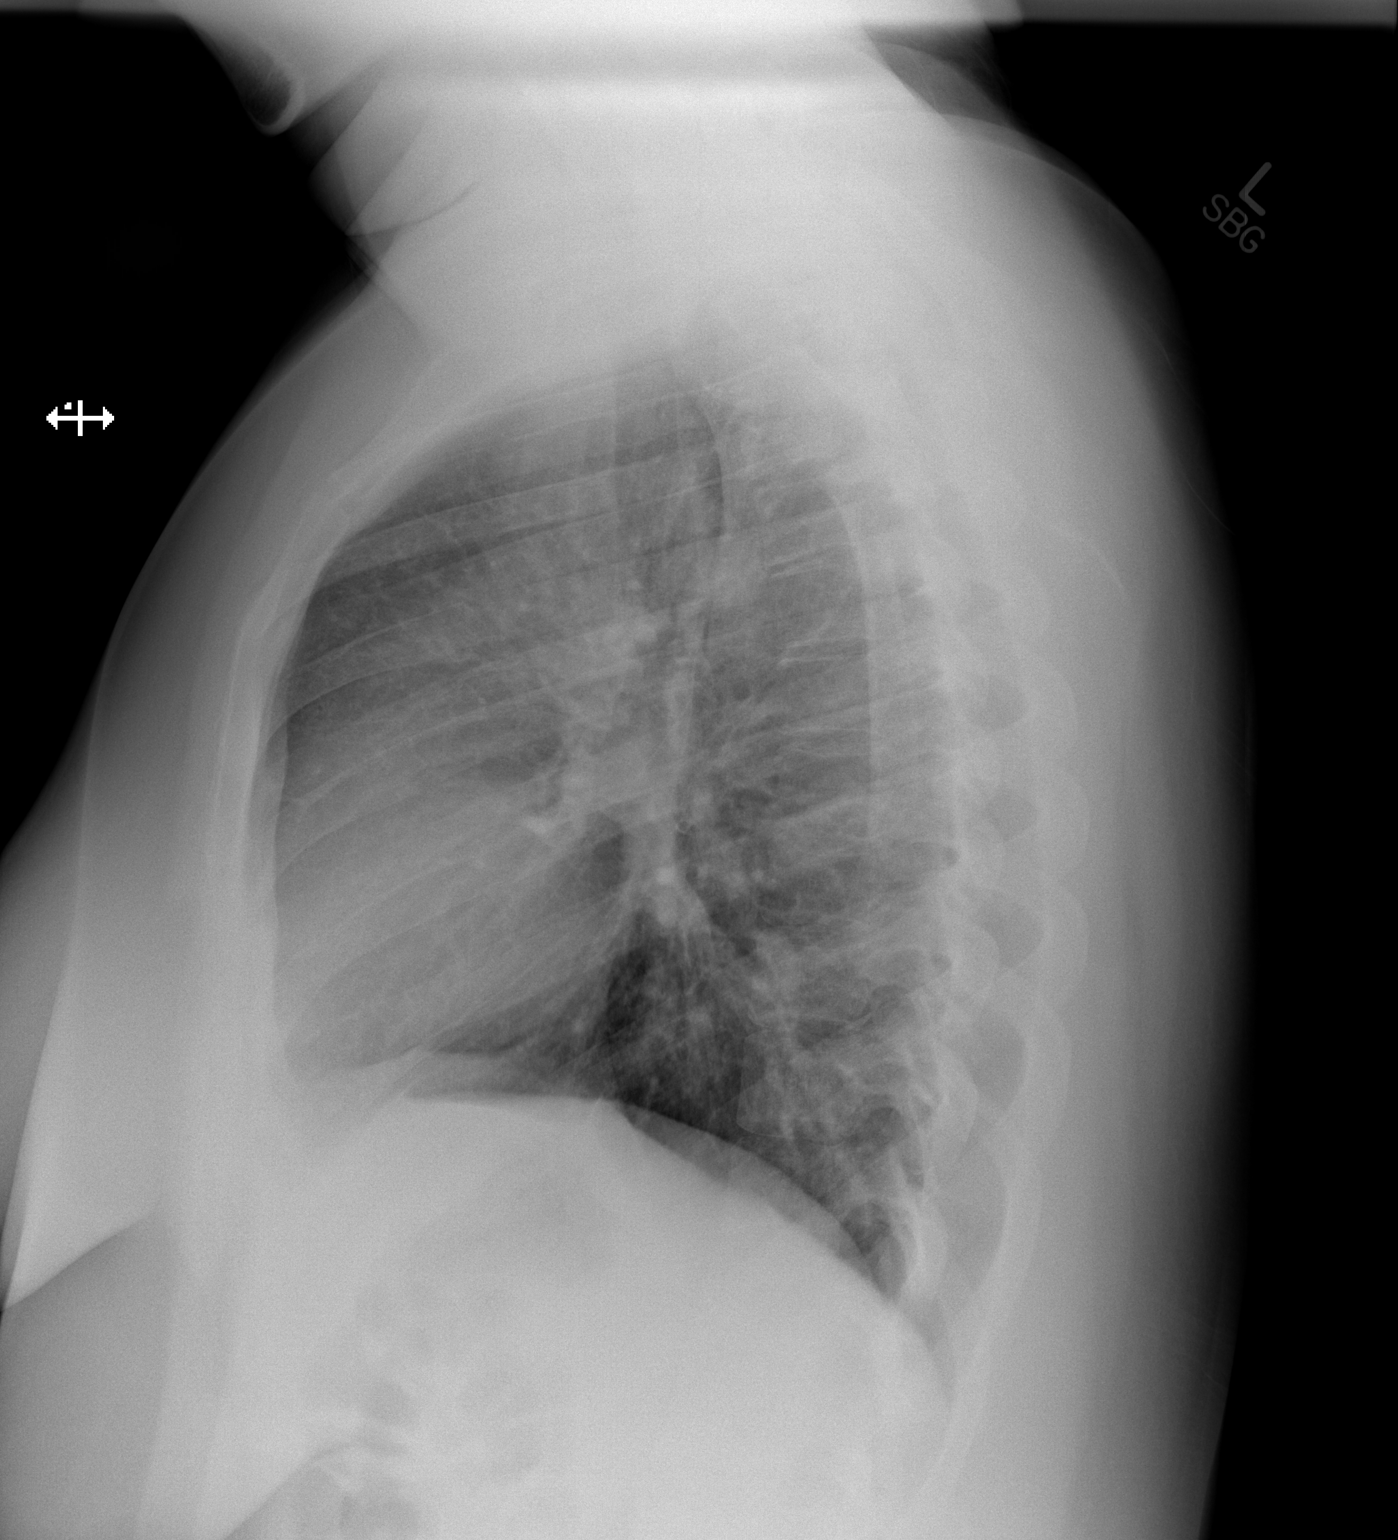

[2 of 2 positions shown; findings below may reference images not displayed]

FINDINGS: The heart size and mediastinal contours are within normal limits.
Both lungs are clear. The visualized skeletal structures are
unremarkable.
IMPRESSION: No active cardiopulmonary disease.

## 2020-04-04 ENCOUNTER — Encounter (HOSPITAL_COMMUNITY): Payer: Self-pay

## 2020-04-04 ENCOUNTER — Ambulatory Visit (HOSPITAL_COMMUNITY)
Admission: EM | Admit: 2020-04-04 | Discharge: 2020-04-04 | Disposition: A | Discharge status: Home / Self Care | Payer: Medicaid Other | Attending: Physician Assistant | Admitting: Physician Assistant

## 2020-04-04 DIAGNOSIS — L03114 Cellulitis of left upper limb: Secondary | ICD-10-CM

## 2020-04-04 DIAGNOSIS — S50862A Insect bite (nonvenomous) of left forearm, initial encounter: Secondary | ICD-10-CM

## 2020-04-04 DIAGNOSIS — W57XXXA Bitten or stung by nonvenomous insect and other nonvenomous arthropods, initial encounter: Secondary | ICD-10-CM

## 2020-04-04 MED ORDER — CEPHALEXIN 500 MG PO CAPS: 500.0000 mg | ORAL_CAPSULE | Freq: Four times a day (QID) | ORAL | 0 refills | Status: DC

## 2020-04-04 MED ORDER — HYDROCORTISONE 2.5 % EX LOTN: TOPICAL_LOTION | Freq: Two times a day (BID) | CUTANEOUS | 0 refills | Status: DC

## 2020-04-04 NOTE — ED Provider Notes (Signed)
[facility]    CSN: [id number] Arrival date & time: 04/04/20  1351      History   Chief Complaint Chief Complaint  Patient presents with  . Insect Bite    HPI Pamela Wong is a 24 y.o. female.   Patient presents with yellowjacket sting to the left arm.  This occurred 2 days ago.  She reports in the last 24 hours there is been significant increase in the redness and swelling.  She continues endorse itching but now it is painful.  She has been trying baking soda.  No other treatments.  No fevers or chills.     Past Medical History:  Diagnosis Date  . Asthma     There are no problems to display for this patient.   History reviewed. No pertinent surgical history.  OB History   No obstetric history on file.      Home Medications    Prior to Admission medications   Medication Sig Start Date End Date Taking? Authorizing Provider  albuterol (VENTOLIN HFA) 108 (90 Base) MCG/ACT inhaler Inhale 2 puffs into the lungs every 6 (six) hours as needed for wheezing or shortness of breath. 11/24/19   Elvina Sidle, MD  cephALEXin (KEFLEX) 500 MG capsule Take 1 capsule (500 mg total) by mouth 4 (four) times daily. 04/04/20   Reizel Calzada, Veryl Speak, PA-C  hydrocortisone 2.5 % lotion Apply topically 2 (two) times daily. 04/04/20   Brianah Hopson, Veryl Speak, PA-C  predniSONE (DELTASONE) 20 MG tablet one daily with food 11/24/19   Elvina Sidle, MD    Family History Family History  Problem Relation Age of Onset  . Healthy Mother     Social History Social History   Tobacco Use  . Smoking status: Former Smoker    Years: 10.00    Types: Cigarettes    Quit date: 01/2016    Years since quitting: 4.2  . Smokeless tobacco: Never Used  Substance Use Topics  . Alcohol use: No  . Drug use: No     Allergies   Patient has no known allergies.   Review of Systems Review of Systems   Physical Exam Triage Vital Signs ED Triage Vitals [04/04/20 1427]  Enc Vitals Group     BP  128/70     Pulse Rate 68     Resp 16     Temp 98.4 F (36.9 C)     Temp Source Oral     SpO2 99 %     Weight      Height      Head Circumference      Peak Flow      Pain Score      Pain Loc      Pain Edu?      Excl. in [place]?    No data found.  Updated Vital Signs BP 128/70 (BP Location: Right Arm)   Pulse 68   Temp 98.4 F (36.9 C) (Oral)   Resp 16   LMP 03/18/2020 (Within Days)   SpO2 99%   Visual Acuity Right Eye Distance:   Left Eye Distance:   Bilateral Distance:    Right Eye Near:   Left Eye Near:    Bilateral Near:     Physical Exam Vitals and nursing note reviewed.  Constitutional:      Appearance: Normal appearance.  Skin:    Comments: Approximately 9 to 10 cm in diameter area of erythema with fairly well-defined borders on the left forearm.  Tender to palpation.  There is a central punctation likely from the bite wound.  Warm to touch.  There is induration centrally.  Neurological:     General: No focal deficit present.     Mental Status: She is alert and oriented to person, place, and time.      UC Treatments / Results  Labs (all labs ordered are listed, but only abnormal results are displayed) Labs Reviewed - No data to display  EKG   Radiology No results found.  Procedures Procedures (including critical care time)  Medications Ordered in UC Medications - No data to display  Initial Impression / Assessment and Plan / UC Course  I have reviewed the triage vital signs and the nursing notes.  Pertinent labs & imaging results that were available during my care of the patient were reviewed by me and considered in my medical decision making (see chart for details).     #Insect bite #Cellulitis Patient is 24 year old presenting with wasp sting with likely a secondary cellulitis.  Given the sudden increase in pain and redness, suspicious for cellulitis.  Will place on Keflex for 5 days.  Topical hydrocortisone lotion for itch.  Cool  compresses.  Strict return and follow-up precautions discussed.  Patient verbalized understanding. Final Clinical Impressions(s) / UC Diagnoses   Final diagnoses:  Insect bite of left forearm, initial encounter  Cellulitis of left upper extremity     Discharge Instructions     Take the antibiotic as prescribed Apply the lotion topically  Apply cool compresses  Follow up with your primary care or this clinic if not improving in 2-3 days      ED Prescriptions    Medication Sig Dispense Auth. Provider   cephALEXin (KEFLEX) 500 MG capsule Take 1 capsule (500 mg total) by mouth 4 (four) times daily. 20 capsule Aundria Bitterman, Veryl Speak, PA-C   hydrocortisone 2.5 % lotion Apply topically 2 (two) times daily. 59 mL Chene Kasinger, Veryl Speak, PA-C     PDMP not reviewed this encounter.   Hermelinda Medicus, PA-C 04/04/20 1517

## 2020-04-04 NOTE — ED Triage Notes (Signed)
Patient c/o bee sting 2 days ago. Came in today because pain and irritation is getting worse. Reports she put a baking soda paste on the area yesterday.

## 2020-04-04 NOTE — Discharge Instructions (Signed)
Take the antibiotic as prescribed Apply the lotion topically  Apply cool compresses  Follow up with your primary care or this clinic if not improving in 2-3 days

## 2020-08-23 ENCOUNTER — Ambulatory Visit (INDEPENDENT_AMBULATORY_CARE_PROVIDER_SITE_OTHER): Payer: Medicaid Other | Admitting: Primary Care

## 2020-09-22 ENCOUNTER — Other Ambulatory Visit (HOSPITAL_COMMUNITY)
Admission: RE | Admit: 2020-09-22 | Discharge: 2020-09-22 | Disposition: A | Discharge status: Home / Self Care | Payer: Medicaid Other | Source: Ambulatory Visit | Attending: Primary Care | Admitting: Primary Care

## 2020-09-22 ENCOUNTER — Other Ambulatory Visit: Payer: Self-pay

## 2020-09-22 ENCOUNTER — Ambulatory Visit (INDEPENDENT_AMBULATORY_CARE_PROVIDER_SITE_OTHER): Payer: Self-pay | Admitting: Primary Care

## 2020-09-22 ENCOUNTER — Encounter (INDEPENDENT_AMBULATORY_CARE_PROVIDER_SITE_OTHER): Payer: Self-pay | Admitting: Primary Care

## 2020-09-22 VITALS — BP 132/78 | HR 101 | Temp 98.1°F | Resp 16 | Ht 67.0 in | Wt 235.0 lb

## 2020-09-22 DIAGNOSIS — J45909 Unspecified asthma, uncomplicated: Secondary | ICD-10-CM

## 2020-09-22 DIAGNOSIS — L0292 Furuncle, unspecified: Secondary | ICD-10-CM

## 2020-09-22 DIAGNOSIS — N898 Other specified noninflammatory disorders of vagina: Secondary | ICD-10-CM | POA: Insufficient documentation

## 2020-09-22 DIAGNOSIS — Z0000 Encounter for general adult medical examination without abnormal findings: Secondary | ICD-10-CM

## 2020-09-22 MED ORDER — ALBUTEROL SULFATE HFA 108 (90 BASE) MCG/ACT IN AERS: 2.0000 | INHALATION_SPRAY | Freq: Four times a day (QID) | RESPIRATORY_TRACT | 11 refills | Status: DC | PRN

## 2020-09-22 NOTE — Progress Notes (Signed)
Established Patient Office Visit  Subjective:  Patient ID: Pamela Wong, female    DOB: 22-Dec-1995  Age: 25 y.o. MRN: [id number]  CC: No chief complaint on file.   HPI Pamela Wong is a 25 year old female who presents for annual physical. Her main concern is discoloration and boils under her arms and thighs. Seen a dermatologist but was told per patient no treatment.   Past Medical History:  Diagnosis Date  . Asthma     No past surgical history on file.  Family History  Problem Relation Age of Onset  . Healthy Mother     Social History   Socioeconomic History  . Marital status: Single    Spouse name: Not on file  . Number of children: Not on file  . Years of education: Not on file  . Highest education level: Not on file  Occupational History  . Not on file  Tobacco Use  . Smoking status: Former Smoker    Years: 10.00    Types: Cigarettes    Quit date: 01/2016    Years since quitting: 4.7  . Smokeless tobacco: Never Used  Substance and Sexual Activity  . Alcohol use: No  . Drug use: No  . Sexual activity: Yes    Birth control/protection: None  Other Topics Concern  . Not on file  Social History Narrative  . Not on file   Social Determinants of Health   Financial Resource Strain: Not on file  Food Insecurity: Not on file  Transportation Needs: Not on file  Physical Activity: Not on file  Stress: Not on file  Social Connections: Not on file  Intimate Partner Violence: Not on file    Outpatient Medications Prior to Visit  Medication Sig Dispense Refill  . hydrocortisone 2.5 % lotion Apply topically 2 (two) times daily. 59 mL 0  . albuterol (VENTOLIN HFA) 108 (90 Base) MCG/ACT inhaler Inhale 2 puffs into the lungs every 6 (six) hours as needed for wheezing or shortness of breath. 6.7 g 11  . cephALEXin (KEFLEX) 500 MG capsule Take 1 capsule (500 mg total) by mouth 4 (four) times daily. (Patient not taking: Reported on 09/22/2020) 20 capsule  0  . predniSONE (DELTASONE) 20 MG tablet one daily with food (Patient not taking: Reported on 09/22/2020) 3 tablet 1   No facility-administered medications prior to visit.    No Known Allergies  ROS Review of Systems  Skin: Positive for rash.       Boils under arms and thighs       Objective:    Physical Exam Vitals:   09/22/20 1451  BP: 132/78  Pulse: (!) 101  Resp: 16  Temp: 98.1 F (36.7 C)  TempSrc: Oral  SpO2: 97%  Weight: 235 lb (106.6 kg)  Height: '5\' 7"'  (1.702 m)   General: Vital signs reviewed.  Patient is well-developed and well-nourished obese , in no acute distress and cooperative with exam.  Head: Normocephalic and atraumatic. Eyes: EOMI, conjunctivae normal, no scleral icterus.  Neck: Supple, trachea midline, normal ROM, no JVD, masses, thyromegaly, or carotid bruit present.  Cardiovascular: RRR, S1 normal, S2 normal, no murmurs, gallops, or rubs. Pulmonary/Chest: bilaterally,  wheezes, a/p no rales, or rhonchi. Abdominal: Soft, non-tender, non-distended, BS +, no masses, organomegaly, or guarding present.  Musculoskeletal: No joint deformities, erythema, or stiffness, ROM full and nontender. Extremities: No lower extremity edema bilaterally,  pulses symmetric and intact bilaterally. No cyanosis or clubbing. Neurological: A&O x3,  Strength is normal and symmetric bilaterally, cranial nerve II-XII are grossly intact, no focal motor deficit, sensory intact to light touch bilaterally.  Skin: Warm, dry and intact. Rashes ,discoloration raised area armpits and thighs Psychiatric: Normal mood and affect. speech and behavior is normal. Cognition and memory are normal.  BP 132/78   Pulse (!) 101   Temp 98.1 F (36.7 C) (Oral)   Resp 16   Ht '5\' 7"'  (1.702 m)   Wt 235 lb (106.6 kg)   LMP 09/09/2020   SpO2 97%   BMI 36.81 kg/m  Wt Readings from Last 3 Encounters:  09/22/20 235 lb (106.6 kg)  09/16/19 218 lb 3.2 oz (99 kg)  08/19/18 200 lb (90.7 kg)     Health  Maintenance Due  Topic Date Due  . Hepatitis C Screening  Never done  . COVID-19 Vaccine (1) Never done  . INFLUENZA VACCINE  Never done    There are no preventive care reminders to display for this patient.  No results found for: TSH Lab Results  Component Value Date   WBC 6.3 09/22/2020   HGB 13.1 09/22/2020   HCT 40.1 09/22/2020   MCV 87 09/22/2020   PLT 255 09/22/2020   Lab Results  Component Value Date   NA 137 09/22/2020   K 4.4 09/22/2020   CO2 24 09/22/2020   GLUCOSE 98 09/22/2020   BUN 8 09/22/2020   CREATININE 0.59 09/22/2020   BILITOT 0.3 09/22/2020   ALKPHOS 52 09/22/2020   AST 16 09/22/2020   ALT 15 09/22/2020   PROT 7.5 09/22/2020   ALBUMIN 4.5 09/22/2020   CALCIUM 9.7 09/22/2020   ANIONGAP 6 10/15/2018     Assessment & Plan:  Diagnoses and all orders for this visit:  Vaginal discharge -     Cervicovaginal ancillary only  Annual physical exam Completed  -     CBC with Differential -     CMP14+EGFR  Boil -     Ambulatory referral to Dermatology  Mild asthma without complication, unspecified whether persistent -     albuterol (VENTOLIN HFA) 108 (90 Base) MCG/ACT inhaler; Inhale 2 puffs into the lungs every 6 (six) hours as needed for wheezing or shortness of breath.   Follow-up: Return if symptoms worsen or fail to improve.    Kerin Perna, NP

## 2020-09-22 NOTE — Patient Instructions (Signed)
Health Maintenance, Female Adopting a healthy lifestyle and getting preventive care are important in promoting health and wellness. Ask your health care provider about:  The right schedule for you to have regular tests and exams.  Things you can do on your own to prevent diseases and keep yourself healthy. What should I know about diet, weight, and exercise? Eat a healthy diet  Eat a diet that includes plenty of vegetables, fruits, low-fat dairy products, and lean protein.  Do not eat a lot of foods that are high in solid fats, added sugars, or sodium.   Maintain a healthy weight Body mass index (BMI) is used to identify weight problems. It estimates body fat based on height and weight. Your health care provider can help determine your BMI and help you achieve or maintain a healthy weight. Get regular exercise Get regular exercise. This is one of the most important things you can do for your health. Most adults should:  Exercise for at least 150 minutes each week. The exercise should increase your heart rate and make you sweat (moderate-intensity exercise).  Do strengthening exercises at least twice a week. This is in addition to the moderate-intensity exercise.  Spend less time sitting. Even light physical activity can be beneficial. Watch cholesterol and blood lipids Have your blood tested for lipids and cholesterol at 25 years of age, then have this test every 5 years. Have your cholesterol levels checked more often if:  Your lipid or cholesterol levels are high.  You are older than 25 years of age.  You are at high risk for heart disease. What should I know about cancer screening? Depending on your health history and family history, you may need to have cancer screening at various ages. This may include screening for:  Breast cancer.  Cervical cancer.  Colorectal cancer.  Skin cancer.  Lung cancer. What should I know about heart disease, diabetes, and high blood  pressure? Blood pressure and heart disease  High blood pressure causes heart disease and increases the risk of stroke. This is more likely to develop in people who have high blood pressure readings, are of African descent, or are overweight.  Have your blood pressure checked: ? Every 3-5 years if you are 18-39 years of age. ? Every year if you are 40 years old or older. Diabetes Have regular diabetes screenings. This checks your fasting blood sugar level. Have the screening done:  Once every three years after age 40 if you are at a normal weight and have a low risk for diabetes.  More often and at a younger age if you are overweight or have a high risk for diabetes. What should I know about preventing infection? Hepatitis B If you have a higher risk for hepatitis B, you should be screened for this virus. Talk with your health care provider to find out if you are at risk for hepatitis B infection. Hepatitis C Testing is recommended for:  Everyone born from 1945 through 1965.  Anyone with known risk factors for hepatitis C. Sexually transmitted infections (STIs)  Get screened for STIs, including gonorrhea and chlamydia, if: ? You are sexually active and are younger than 24 years of age. ? You are older than 24 years of age and your health care provider tells you that you are at risk for this type of infection. ? Your sexual activity has changed since you were last screened, and you are at increased risk for chlamydia or gonorrhea. Ask your health care provider   if you are at risk.  Ask your health care provider about whether you are at high risk for HIV. Your health care provider may recommend a prescription medicine to help prevent HIV infection. If you choose to take medicine to prevent HIV, you should first get tested for HIV. You should then be tested every 3 months for as long as you are taking the medicine. Pregnancy  If you are about to stop having your period (premenopausal) and  you may become pregnant, seek counseling before you get pregnant.  Take 400 to 800 micrograms (mcg) of folic acid every day if you become pregnant.  Ask for birth control (contraception) if you want to prevent pregnancy. Osteoporosis and menopause Osteoporosis is a disease in which the bones lose minerals and strength with aging. This can result in bone fractures. If you are 65 years old or older, or if you are at risk for osteoporosis and fractures, ask your health care provider if you should:  Be screened for bone loss.  Take a calcium or vitamin D supplement to lower your risk of fractures.  Be given hormone replacement therapy (HRT) to treat symptoms of menopause. Follow these instructions at home: Lifestyle  Do not use any products that contain nicotine or tobacco, such as cigarettes, e-cigarettes, and chewing tobacco. If you need help quitting, ask your health care provider.  Do not use street drugs.  Do not share needles.  Ask your health care provider for help if you need support or information about quitting drugs. Alcohol use  Do not drink alcohol if: ? Your health care provider tells you not to drink. ? You are pregnant, may be pregnant, or are planning to become pregnant.  If you drink alcohol: ? Limit how much you use to 0-1 drink a day. ? Limit intake if you are breastfeeding.  Be aware of how much alcohol is in your drink. In the [place] one drink equals one 12 oz bottle of beer (355 mL), one 5 oz glass of wine (148 mL), or one 1 oz glass of hard liquor (44 mL). General instructions  Schedule regular health, dental, and eye exams.  Stay current with your vaccines.  Tell your health care provider if: ? You often feel depressed. ? You have ever been abused or do not feel safe at home. Summary  Adopting a healthy lifestyle and getting preventive care are important in promoting health and wellness.  Follow your health care provider's instructions about healthy  diet, exercising, and getting tested or screened for diseases.  Follow your health care provider's instructions on monitoring your cholesterol and blood pressure. This information is not intended to replace advice given to you by your health care provider. Make sure you discuss any questions you have with your health care provider. Document Revised: 08/14/2018 Document Reviewed: 08/14/2018 Elsevier Patient Education  2021 [employer]  

## 2020-09-22 NOTE — Progress Notes (Signed)
Need refill on albuterol  Referral to dermatology for blisters on leg

## 2020-09-23 LAB — CMP14+EGFR
ALT: 15 IU/L (ref 0–32)
AST: 16 IU/L (ref 0–40)
Albumin/Globulin Ratio: 1.5 (ref 1.2–2.2)
Albumin: 4.5 g/dL (ref 3.9–5.0)
Alkaline Phosphatase: 52 IU/L (ref 44–121)
BUN/Creatinine Ratio: 14 (ref 9–23)
BUN: 8 mg/dL (ref 6–20)
Bilirubin Total: 0.3 mg/dL (ref 0.0–1.2)
CO2: 24 mmol/L (ref 20–29)
Calcium: 9.7 mg/dL (ref 8.7–10.2)
Chloride: 103 mmol/L (ref 96–106)
Creatinine, Ser: 0.59 mg/dL (ref 0.57–1.00)
GFR calc Af Amer: 148 mL/min/{1.73_m2} (ref >59)
GFR calc non Af Amer: 129 mL/min/{1.73_m2} (ref >59)
Globulin, Total: 3 g/dL (ref 1.5–4.5)
Glucose: 98 mg/dL (ref 65–99)
Potassium: 4.4 mmol/L (ref 3.5–5.2)
Sodium: 137 mmol/L (ref 134–144)
Total Protein: 7.5 g/dL (ref 6.0–8.5)

## 2020-09-23 LAB — CBC WITH DIFFERENTIAL/PLATELET
Basophils Absolute: 0 10*3/uL (ref 0.0–0.2)
Basos: 1 %
EOS (ABSOLUTE): 0 10*3/uL (ref 0.0–0.4)
Eos: 1 %
Hematocrit: 40.1 % (ref 34.0–46.6)
Hemoglobin: 13.1 g/dL (ref 11.1–15.9)
Immature Grans (Abs): 0 10*3/uL (ref 0.0–0.1)
Immature Granulocytes: 1 %
Lymphocytes Absolute: 2.3 10*3/uL (ref 0.7–3.1)
Lymphs: 37 %
MCH: 28.4 pg (ref 26.6–33.0)
MCHC: 32.7 g/dL (ref 31.5–35.7)
MCV: 87 fL (ref 79–97)
Monocytes Absolute: 0.4 10*3/uL (ref 0.1–0.9)
Monocytes: 6 %
Neutrophils Absolute: 3.5 10*3/uL (ref 1.4–7.0)
Neutrophils: 54 %
Platelets: 255 10*3/uL (ref 150–450)
RBC: 4.61 x10E6/uL (ref 3.77–5.28)
RDW: 12.1 % (ref 11.7–15.4)
WBC: 6.3 10*3/uL (ref 3.4–10.8)

## 2020-09-24 LAB — CERVICOVAGINAL ANCILLARY ONLY
Bacterial Vaginitis (gardnerella): POSITIVE — AB
Candida Glabrata: NEGATIVE
Candida Vaginitis: NEGATIVE
Chlamydia: NEGATIVE
Comment: NEGATIVE
Comment: NEGATIVE
Comment: NEGATIVE
Comment: NEGATIVE
Comment: NEGATIVE
Comment: NORMAL
Neisseria Gonorrhea: NEGATIVE
Trichomonas: NEGATIVE

## 2020-09-27 ENCOUNTER — Other Ambulatory Visit (INDEPENDENT_AMBULATORY_CARE_PROVIDER_SITE_OTHER): Payer: Self-pay | Admitting: Primary Care

## 2020-09-27 DIAGNOSIS — B9689 Other specified bacterial agents as the cause of diseases classified elsewhere: Secondary | ICD-10-CM

## 2020-09-27 MED ORDER — METRONIDAZOLE 500 MG PO TABS: 500.0000 mg | ORAL_TABLET | Freq: Two times a day (BID) | ORAL | 0 refills | Status: DC

## 2020-11-18 ENCOUNTER — Encounter (INDEPENDENT_AMBULATORY_CARE_PROVIDER_SITE_OTHER): Payer: Self-pay | Admitting: Primary Care

## 2020-11-29 ENCOUNTER — Encounter (INDEPENDENT_AMBULATORY_CARE_PROVIDER_SITE_OTHER): Payer: Self-pay | Admitting: Primary Care

## 2020-11-29 ENCOUNTER — Other Ambulatory Visit: Payer: Self-pay

## 2020-11-29 ENCOUNTER — Other Ambulatory Visit: Payer: Self-pay | Admitting: Primary Care

## 2020-11-29 ENCOUNTER — Ambulatory Visit (INDEPENDENT_AMBULATORY_CARE_PROVIDER_SITE_OTHER): Payer: Self-pay | Admitting: Primary Care

## 2020-11-29 VITALS — BP 148/94 | HR 81 | Temp 97.3°F | Ht 67.0 in | Wt 240.2 lb

## 2020-11-29 DIAGNOSIS — R030 Elevated blood-pressure reading, without diagnosis of hypertension: Secondary | ICD-10-CM

## 2020-11-29 DIAGNOSIS — Z111 Encounter for screening for respiratory tuberculosis: Secondary | ICD-10-CM

## 2020-11-29 DIAGNOSIS — F17200 Nicotine dependence, unspecified, uncomplicated: Secondary | ICD-10-CM

## 2020-11-29 DIAGNOSIS — J45909 Unspecified asthma, uncomplicated: Secondary | ICD-10-CM

## 2020-11-29 MED ORDER — ALBUTEROL SULFATE HFA 108 (90 BASE) MCG/ACT IN AERS: 2.0000 | INHALATION_SPRAY | Freq: Four times a day (QID) | RESPIRATORY_TRACT | 1 refills | Status: DC | PRN

## 2020-11-29 NOTE — Patient Instructions (Addendum)
Tuberculin Skin Test Why am I having this test? The tuberculin skin test is used to check whether a person has been exposed to the bacteria that causes tuberculosis (TB) (Mycobacterium tuberculosis). Tuberculosis is a bacterial infection that usually affects the lungs but can affect other parts of the body. You may have a tuberculin skin test if:  You have possible symptoms of TB, such as: ? Coughing up blood, mucus from the lungs (sputum), or both. ? A cough that lasts three weeks or longer. ? Chest pain, or pain while breathing or coughing. ? Unexplained weight loss. ? Fatigue and weakness. ? Fever, sweating, and chills. ? Loss of appetite.  You are at high risk for getting TB. You may be at high risk if you: ? Inject illegal drugs or share needles. ? Have HIV or other diseases that affect the body's disease-fighting system (immune system). ? Work in a health care facility. ? Live in a high-risk community, such as a homeless shelter, nursing home, or correctional facility. ? Have had contact with someone who has TB. ? Are from or have traveled to a country where TB is common. If you are at high risk, you may need to have regular TB screenings. TB screening may be required when starting a new job, such as becoming a Public house manager or a Pharmacist, hospital. Colleges or universities may require TB screening for new students. What is being tested? This test checks for the presence of TB antibodies in the body. Antibodies are part of the body's immune system. After you get an infection, your body makes antibodies that stay in your body after you recover and protect you from getting the same infection again. Tell a health care provider about:  Any allergies you have.  All medicines you are taking, including vitamins, herbs, eye drops, creams, and over-the-counter medicines.  Any blood disorders you have.  Any surgeries you have had.  Any medical conditions you have.  Whether you are pregnant  or may be pregnant. What happens during the test? Your health care provider will inject a solution called PPD (purified protein derivative) under the first layer of skin on your arm. This causes a small, blister-like bump to form over the area temporarily. PPD is made from the bacteria that causes TB. PPD causes your immune system to react, but it does not get you sick with TB. You may feel mild stinging as this happens. Afterward, the area may itch or burn.   How are the results reported? To get your test results, you will need to see your health care provider again within 2-3 days after you received the injection. It is important to follow your health care provider's instructions about when to be seen again. If you are not seen within 2-3 days, you may need to have the test repeated. At your follow-up visit, your health care provider will measure the area where the PPD was injected to see if the bump has gotten larger due to swelling. Your results will be reported as positive or negative.  If the bump has disappeared or is small, your test result is negative. Negative means that you do not have the antibodies.  If the bump is large, your test result is positive. Positive means that you have the antibodies. Swelling is caused by the antibodies reacting with the PPD. The skin may also turn red around the bump. A false-positive result can occur. A false positive is incorrect because it means that a condition is present  when it is not. A false-negative result can occur. A false negative is incorrect because it means that a condition is not present when it is. False negatives are rare and are more likely to occur in older people and in people who have weakened immune systems. What do the results mean? A negative result means that it is unlikely that you have TB or that you have been exposed to TB bacteria. This test may be repeated, or you may have a blood test to check for TB. This is because your body  may not react to the tuberculin skin test until several weeks after exposure to TB bacteria. A positive result means that you have been exposed to TB, and you may need more tests to determine if you have:  Active TB, also called TB disease. This means that you have TB symptoms and your infection can spread to others (you are contagious).  Latent TB. This means that you do not have any symptoms of TB and you are not contagious. Latent TB can turn into active TB. Talk with your health care provider about what your results mean. Questions to ask your health care provider Ask your health care provider, or the department that is doing the test:  When will my results be ready?  How will I get my results?  What are my treatment options?  What other tests do I need?  What are my next steps? Summary  The tuberculin skin test is used to check whether a person has been exposed to the bacteria that causes tuberculosis (TB).  Your health care provider will inject a solution known as PPD (purified protein derivative) under the first layer of skin on your arm.  After 2-3 days, your health care provider will measure the area where the PPD was injected to see if the bump has gotten larger due to swelling.  Your results will be reported as positive or negative. A positive result means that you have been exposed to TB. A negative result means that it is unlikely that you have TB or that you have been exposed to TB bacteria. This information is not intended to replace advice given to you by your health care provider. Make sure you discuss any questions you have with your health care provider. Document Revised: 05/13/2020 Document Reviewed: 04/22/2020 Elsevier Patient Education  2021 Else[employer] Inc.  Preventing Hypertension Hypertension, also called high blood pressure, is when the force of blood pumping through the arteries is too strong. Arteries are blood vessels that carry blood from the heart  throughout the body. Often, hypertension does not cause symptoms until blood pressure is very high. It is important to have your blood pressure checked regularly. Diet and lifestyle changes can help you prevent hypertension, and they may make you feel better overall and improve your quality of life. If you already have hypertension, you may control it with diet and lifestyle changes, as well as with medicine. How can this condition affect me? Over time, hypertension can damage the arteries and decrease blood flow to important parts of the body, including the brain, heart, and kidneys. By keeping your blood pressure in a healthy range, you can help prevent complications like heart attack, heart failure, stroke, kidney failure, and vascular dementia. What can increase my risk?  Being an older adult. Older people are more often affected.  Having family members who have had high blood pressure.  Being obese.  Being female. Males are more likely to have high blood  pressure.  Drinking too much alcohol or caffeine.  Smoking or using illegal drugs.  Taking certain medicines, such as antidepressants, decongestants, birth control pills, and NSAIDs, such as ibuprofen.  Having thyroid problems.  Having certain tumors. What actions can I take to prevent or manage this condition? Work with your health care provider to make a hypertension prevention plan that works for you. Follow your plan and keep all follow-up visits as told by your health care provider. Diet changes Maintain a healthy diet. This includes:  Eating less salt (sodium). Ask your health care provider how much sodium is safe for you to have. The general recommendation is to have less than 1 tsp (2,300 mg) of sodium a day. ? Do not add salt to your food. ? Choose low-sodium options when grocery shopping and eating out.  Limiting fats in your diet. You can do this by eating low-fat or fat-free dairy products and by eating less red  meat.  Eating more fruits, vegetables, and whole grains. Make a goal to eat: ? 1-2 cups of fresh fruits and vegetables each day. ? 3-4 servings of whole grains each day.  Avoiding foods and beverages that have added sugars.  Eating fish that contain healthy fats (omega-3 fatty acids), such as mackerel or salmon. If you need help putting together a healthy eating plan, try the DASH diet. This diet is high in fruits, vegetables, and whole grains. It is low in sodium, red meat, and added sugars. DASH stands for Dietary Approaches to Stop Hypertension.   Lifestyle changes Lose weight if you are overweight. Losing just 3?5% of your body weight can help prevent or control hypertension. For example, if your present weight is 200 lb (91 kg), a loss of 3-5% of your weight means losing 6-10 lb (2.7-4.5 kg). Ask your health care provider to help you with a diet and exercise plan to safely lose weight. Other recommendations usually include:  Get enough exercise. Do at least 150 minutes of moderate-intensity exercise each week. You could do this in short exercise sessions several times a day, or you could do longer exercise sessions a few times a week. For example, you could take a brisk 10-minute walk or bike ride, 3 times a day, for 5 days a week.  Find ways to reduce stress, such as exercising, meditating, listening to music, or taking a yoga class. If you need help reducing stress, ask your health care provider.  Do not use any products that contain nicotine or tobacco, such as cigarettes, e-cigarettes, and chewing tobacco. If you need help quitting, ask your health care provider. Chemicals in tobacco and nicotine products raise your blood pressure each time you use them. If you need help quitting, ask your health care provider.  Learn how to check your blood pressure at home. Make sure that you know your personal target blood pressure, as told by your health care provider.  Try to sleep 7-9 hours per  night.   Alcohol use  Do not drink alcohol if: ? Your health care provider tells you not to drink. ? You are pregnant, may be pregnant, or are planning to become pregnant.  If you drink alcohol: ? Limit how much you use to:  0-1 drink a day for women.  0-2 drinks a day for men. ? Be aware of how much alcohol is in your drink. In the [place] one drink equals one 12 oz bottle of beer (355 mL), one 5 oz glass of wine (148 mL),  or one 1 oz glass of hard liquor (44 mL). Medicines In addition to diet and lifestyle changes, your health care provider may recommend medicines to help lower your blood pressure. In general:  You may need to try a few different medicines to find what works best for you.  You may need to take more than one medicine.  Take over-the-counter and prescription medicines only as told by your health care provider. Questions to ask your health care provider  What is my blood pressure goal?  How can I lower my risk for high blood pressure?  How should I monitor my blood pressure at home? Where to find support Your health care provider can help you prevent hypertension and help you keep your blood pressure at a healthy level. Your local hospital or your community may also provide support services and prevention programs. The American Heart Association offers an online support network at supportnetwork.heart.org Where to find more information Learn more about hypertension from:  National Heart, Lung, and Blood Institute: PopSteam.is  Centers for Disease Control and Prevention: FootballExhibition.com.br  American Academy of Family Physicians: familydoctor.org Learn more about the DASH diet from:  National Heart, Lung, and Blood Institute: PopSteam.is Contact a health care provider if:  You think you are having a reaction to medicines you have taken.  You have recurrent headaches or feel dizzy.  You have swelling in your ankles.  You have trouble with your  vision. Get help right away if:  You have sudden, severe chest, back, or abdominal pain or discomfort.  You have shortness of breath.  You have a sudden, severe headache. These symptoms may represent a serious problem that is an emergency. Do not wait to see if the symptoms will go away. Get medical help right away. Call your local emergency services (911 in the [place] Do not drive yourself to the hospital.  Summary  Hypertension often does not cause any symptoms until blood pressure is very high. It is important to get your blood pressure checked regularly.  Diet and lifestyle changes are important steps in preventing hypertension.  By keeping your blood pressure in a healthy range, you may prevent complications like heart attack, heart failure, stroke, and kidney failure.  Work with your health care provider to make a hypertension prevention plan that works for you. This information is not intended to replace advice given to you by your health care provider. Make sure you discuss any questions you have with your health care provider. Document Revised: 07/22/2019 Document Reviewed: 07/22/2019 Elsevier Patient Education  2021 [employer].

## 2020-11-29 NOTE — Addendum Note (Signed)
Addended by: Grayce Sessions on: 11/29/2020 10:45 AM   Modules accepted: Orders, Level of Service

## 2020-11-29 NOTE — Progress Notes (Addendum)
Established Patient Office Visit  Subjective:  Patient ID: Pamela Wong, female    DOB: 12/25/1995  Age: 25 y.o. MRN: [id number]  CC:  Chief Complaint  Patient presents with  . PPD Placement    HPI Pamela Wong is a 25 year old female presents for PPD for new employment and elevated Blood pressure 148/94 last reading slightly elevated. Denies shortness of breath, headaches, chest pain or lower extremity edema.   Past Medical History:  Diagnosis Date  . Asthma     History reviewed. No pertinent surgical history.  Family History  Problem Relation Age of Onset  . Healthy Mother     Social History   Socioeconomic History  . Marital status: Single    Spouse name: Not on file  . Number of children: Not on file  . Years of education: Not on file  . Highest education level: Not on file  Occupational History  . Not on file  Tobacco Use  . Smoking status: Former Smoker    Years: 10.00    Types: Cigarettes    Quit date: 01/2016    Years since quitting: 4.9  . Smokeless tobacco: Never Used  Substance and Sexual Activity  . Alcohol use: No  . Drug use: No  . Sexual activity: Yes    Birth control/protection: None  Other Topics Concern  . Not on file  Social History Narrative  . Not on file   Social Determinants of Health   Financial Resource Strain: Not on file  Food Insecurity: Not on file  Transportation Needs: Not on file  Physical Activity: Not on file  Stress: Not on file  Social Connections: Not on file  Intimate Partner Violence: Not on file    Outpatient Medications Prior to Visit  Medication Sig Dispense Refill  . albuterol (VENTOLIN HFA) 108 (90 Base) MCG/ACT inhaler Inhale 2 puffs into the lungs every 6 (six) hours as needed for wheezing or shortness of breath. 6.7 g 11  . hydrocortisone 2.5 % lotion Apply topically 2 (two) times daily. 59 mL 0  . metroNIDAZOLE (FLAGYL) 500 MG tablet Take 1 tablet (500 mg total) by mouth 2 (two) times  daily. 14 tablet 0   No facility-administered medications prior to visit.    No Known Allergies  ROS Review of Systems  All other systems reviewed and are negative.     Objective:    Physical Exam Vitals reviewed.  Constitutional:      Appearance: She is obese.  HENT:     Head: Normocephalic.     Right Ear: External ear normal.     Left Ear: External ear normal.     Nose: Nose normal.  Eyes:     Extraocular Movements: Extraocular movements intact.  Neck:     Comments: Supple  Cardiovascular:     Rate and Rhythm: Normal rate and regular rhythm.  Pulmonary:     Effort: Pulmonary effort is normal.     Breath sounds: Wheezing present.     Comments: RLL and LUL  Abdominal:     General: Bowel sounds are normal. There is distension.     Palpations: Abdomen is soft.  Musculoskeletal:        General: Normal range of motion.     Cervical back: Normal range of motion. Rigidity present.  Skin:    General: Skin is warm and dry.  Neurological:     Mental Status: She is alert and oriented to person, place, and  time.  Psychiatric:        Mood and Affect: Mood normal.        Behavior: Behavior normal.        Thought Content: Thought content normal.        Judgment: Judgment normal.     BP (!) 148/94 (BP Location: Right Arm, Patient Position: Sitting, Cuff Size: Large)   Pulse 81   Temp (!) 97.3 F (36.3 C) (Temporal)   Ht 5\' 7"  (1.702 m)   Wt 240 lb 3.2 oz (109 kg)   LMP 11/29/2020 (Exact Date)   SpO2 97%   BMI 37.62 kg/m  Wt Readings from Last 3 Encounters:  11/29/20 240 lb 3.2 oz (109 kg)  09/22/20 235 lb (106.6 kg)  09/16/19 218 lb 3.2 oz (99 kg)     Health Maintenance Due  Topic Date Due  . Hepatitis C Screening  Never done  . COVID-19 Vaccine (1) Never done  . HPV VACCINES (1 - 2-dose series) Never done       Topic Date Due  . HPV VACCINES (1 - 2-dose series) Never done    No results found for: TSH Lab Results  Component Value Date   WBC 6.3  09/22/2020   HGB 13.1 09/22/2020   HCT 40.1 09/22/2020   MCV 87 09/22/2020   PLT 255 09/22/2020   Lab Results  Component Value Date   NA 137 09/22/2020   K 4.4 09/22/2020   CO2 24 09/22/2020   GLUCOSE 98 09/22/2020   BUN 8 09/22/2020   CREATININE 0.59 09/22/2020   BILITOT 0.3 09/22/2020   ALKPHOS 52 09/22/2020   AST 16 09/22/2020   ALT 15 09/22/2020   PROT 7.5 09/22/2020   ALBUMIN 4.5 09/22/2020   CALCIUM 9.7 09/22/2020   ANIONGAP 6 10/15/2018   No results found for: CHOL No results found for: HDL No results found for: LDLCALC No results found for: TRIG No results found for: CHOLHDL No results found for: 12/14/2018    Assessment & Plan:  Pamela Wong was seen today for ppd placement.  Diagnoses and all orders for this visit:  Encounter for PPD test -     PPD  Elevated blood pressure reading without diagnosis of hypertension Counseled on blood pressure goal of less than 130/80, low-sodium, DASH diet, medication compliance, 150 minutes of moderate intensity exercise per week. Life style modification.   Mild asthma without complication, unspecified whether persistent Unable to afford SABA $45 will send prescription to [facility] . Will monitor use to determine if LABA is needed.   Tobacco dependence Discussed cessation and can increase asthma flare ups, elevate Bp and cause hypoventilation combine with obesity   Follow-up: Return in about 3 months (around 03/01/2021) for PPD reading 72 hrs / Bp f/u.    03/03/2021, NP

## 2020-12-01 ENCOUNTER — Other Ambulatory Visit: Payer: Self-pay

## 2020-12-01 ENCOUNTER — Ambulatory Visit (INDEPENDENT_AMBULATORY_CARE_PROVIDER_SITE_OTHER): Payer: Self-pay

## 2021-01-08 ENCOUNTER — Emergency Department (HOSPITAL_COMMUNITY)
Admission: EM | Admit: 2021-01-08 | Discharge: 2021-01-08 | Disposition: A | Discharge status: Home / Self Care | Payer: Medicaid Other | Attending: Emergency Medicine | Admitting: Emergency Medicine

## 2021-01-08 ENCOUNTER — Other Ambulatory Visit: Payer: Self-pay

## 2021-01-08 DIAGNOSIS — F410 Panic disorder [episodic paroxysmal anxiety] without agoraphobia: Secondary | ICD-10-CM | POA: Insufficient documentation

## 2021-01-08 DIAGNOSIS — R112 Nausea with vomiting, unspecified: Secondary | ICD-10-CM | POA: Insufficient documentation

## 2021-01-08 DIAGNOSIS — R197 Diarrhea, unspecified: Secondary | ICD-10-CM | POA: Insufficient documentation

## 2021-01-08 DIAGNOSIS — F8081 Childhood onset fluency disorder: Secondary | ICD-10-CM

## 2021-01-08 DIAGNOSIS — R4782 Fluency disorder in conditions classified elsewhere: Secondary | ICD-10-CM | POA: Insufficient documentation

## 2021-01-08 LAB — URINALYSIS, ROUTINE W REFLEX MICROSCOPIC
Bacteria, UA: NONE SEEN
Bilirubin Urine: NEGATIVE
Glucose, UA: NEGATIVE mg/dL
Hgb urine dipstick: NEGATIVE
Ketones, ur: 80 mg/dL — AB
Leukocytes,Ua: NEGATIVE
Nitrite: NEGATIVE
Protein, ur: 30 mg/dL — AB
Specific Gravity, Urine: 1.021 (ref 1.005–1.030)
pH: 6 (ref 5.0–8.0)

## 2021-01-08 LAB — CBC
HCT: 45.1 % (ref 36.0–46.0)
Hemoglobin: 14.5 g/dL (ref 12.0–15.0)
MCH: 29.7 pg (ref 26.0–34.0)
MCHC: 32.2 g/dL (ref 30.0–36.0)
MCV: 92.4 fL (ref 80.0–100.0)
Platelets: 185 10*3/uL (ref 150–400)
RBC: 4.88 MIL/uL (ref 3.87–5.11)
RDW: 13.9 % (ref 11.5–15.5)
WBC: 5.3 10*3/uL (ref 4.0–10.5)
nRBC: 0 % (ref 0.0–0.2)

## 2021-01-08 LAB — COMPREHENSIVE METABOLIC PANEL
ALT: 23 U/L (ref 0–44)
AST: 26 U/L (ref 15–41)
Albumin: 4.1 g/dL (ref 3.5–5.0)
Alkaline Phosphatase: 43 U/L (ref 38–126)
Anion gap: 8 (ref 5–15)
BUN: 5 mg/dL — ABNORMAL LOW (ref 6–20)
CO2: 26 mmol/L (ref 22–32)
Calcium: 9.1 mg/dL (ref 8.9–10.3)
Chloride: 101 mmol/L (ref 98–111)
Creatinine, Ser: 0.74 mg/dL (ref 0.44–1.00)
GFR, Estimated: >60 mL/min (ref >60)
Glucose, Bld: 105 mg/dL — ABNORMAL HIGH (ref 70–99)
Potassium: 4.3 mmol/L (ref 3.5–5.1)
Sodium: 135 mmol/L (ref 135–145)
Total Bilirubin: 0.7 mg/dL (ref 0.3–1.2)
Total Protein: 8.2 g/dL — ABNORMAL HIGH (ref 6.5–8.1)

## 2021-01-08 LAB — I-STAT BETA HCG BLOOD, ED (MC, WL, AP ONLY): I-stat hCG, quantitative: <5 m[IU]/mL (ref <5)

## 2021-01-08 LAB — LIPASE, BLOOD: Lipase: 21 U/L (ref 11–51)

## 2021-01-08 MED ORDER — LORAZEPAM 2 MG/ML IJ SOLN
1.0000 mg | Freq: Once | INTRAMUSCULAR | Status: AC
  Administered 2021-01-08: 1 mg via INTRAMUSCULAR
  Filled 2021-01-08: qty 1

## 2021-01-08 MED ORDER — ONDANSETRON 4 MG PO TBDP: 4.0000 mg | ORAL_TABLET | Freq: Three times a day (TID) | ORAL | 0 refills | Status: DC | PRN
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
## 2021-01-08 NOTE — Discharge Instructions (Signed)
  You were evaluated in the Emergency Department and after careful evaluation, we did not find any emergent condition requiring admission or further testing in the hospital.   Your exam/testing today was overall reassuring.  Symptoms seem to be due to panic attack.  I want you to use the attached instructions to help manage her anxiety, if you have future attacks you can come back here.  Please follow-up with your primary care doctor about this.  May need to start medications for your anxiety.  In regards to her nausea vomiting diarrhea and abdominal pain, I want you to use the attached instructions as well, I did prescribe you some nausea medications that you can take as prescribed.  Please follow-up with your primary care in the next couple of days make sure to stay hydrated.  If you have worsening abdominal pain or new worsening concerning symptoms please come back to the ER.  Please return to the Emergency Department if you experience any worsening of your condition.  Thank you for allowing [place] to be a part of your care. Please speak to your pharmacist about any new medications prescribed today in regards to side effects or interactions with other medications.
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
## 2021-01-08 NOTE — ED Triage Notes (Signed)
Pt stuttering.  States she is under a lot of stress and started stuttering last night while having a panic attack.  C/o pain to R abd, back, shaking all over, nausea, vomiting, and diarrhea since last night.

## 2021-01-08 NOTE — ED Notes (Signed)
Pt given saltine crackers, peanut butter, and orange juice.

## 2021-01-08 NOTE — ED Notes (Signed)
Pt is ambulatory to the br. 

## 2021-03-01 ENCOUNTER — Other Ambulatory Visit: Payer: Self-pay

## 2021-03-01 ENCOUNTER — Ambulatory Visit (INDEPENDENT_AMBULATORY_CARE_PROVIDER_SITE_OTHER): Payer: Self-pay | Admitting: Primary Care

## 2021-03-01 ENCOUNTER — Encounter (INDEPENDENT_AMBULATORY_CARE_PROVIDER_SITE_OTHER): Payer: Self-pay | Admitting: Primary Care

## 2021-03-01 VITALS — BP 113/73 | HR 81 | Temp 97.3°F | Resp 16 | Wt 222.0 lb

## 2021-03-01 DIAGNOSIS — F17200 Nicotine dependence, unspecified, uncomplicated: Secondary | ICD-10-CM

## 2021-03-01 DIAGNOSIS — Z0130 Encounter for examination of blood pressure without abnormal findings: Secondary | ICD-10-CM

## 2021-03-01 DIAGNOSIS — F17210 Nicotine dependence, cigarettes, uncomplicated: Secondary | ICD-10-CM

## 2021-03-01 DIAGNOSIS — R600 Localized edema: Secondary | ICD-10-CM

## 2021-03-01 NOTE — Progress Notes (Signed)
Established Patient Office Visit  Subjective:  Patient ID: Pamela Wong, female    DOB: [birth year]  Age: 25 y.o. MRN: [id number]  CC: No chief complaint on file.   HPI Pamela Wong is a 25 year old female who presents for blood pressure follow-up.  Previously we discussed changing dietary intake reduction and sodium, snacks and seasoning.  If she returned and blood pressure was greater than 130/80 she will be placed on blood pressure medication.  Patient's blood pressure today is unremarkable without medication 113/73.  No medication is warranted at this time.  Very please with lifestyle modifications.  Past Medical History:  Diagnosis Date   Asthma     History reviewed. No pertinent surgical history.  Family History  Problem Relation Age of Onset   Healthy Mother     Social History   Socioeconomic History   Marital status: Single    Spouse name: Not on file   Number of children: Not on file   Years of education: Not on file   Highest education level: Not on file  Occupational History   Not on file  Tobacco Use   Smoking status: Former    Years: 10.00    Pack years: 0.00    Types: Cigarettes    Quit date: 01/2016    Years since quitting: 5.1   Smokeless tobacco: Never  Substance and Sexual Activity   Alcohol use: No   Drug use: No   Sexual activity: Yes    Birth control/protection: None  Other Topics Concern   Not on file  Social History Narrative   Not on file   Social Determinants of Health   Financial Resource Strain: Not on file  Food Insecurity: Not on file  Transportation Needs: Not on file  Physical Activity: Not on file  Stress: Not on file  Social Connections: Not on file  Intimate Partner Violence: Not on file    Outpatient Medications Prior to Visit  Medication Sig Dispense Refill   albuterol (VENTOLIN HFA) 108 (90 Base) MCG/ACT inhaler Inhale 2 puffs into the lungs every 6 (six) hours as needed for wheezing. 18 g 1    albuterol (VENTOLIN HFA) 108 (90 Base) MCG/ACT inhaler INHALE 2 PUFFS INTO THE LUNGS EVERY 6 (SIX) HOURS AS NEEDED FOR WHEEZING. 18 g 1   hydrocortisone 2.5 % lotion Apply topically 2 (two) times daily. (Patient not taking: No sig reported) 59 mL 0   ondansetron (ZOFRAN ODT) 4 MG disintegrating tablet Take 1 tablet (4 mg total) by mouth every 8 (eight) hours as needed for nausea or vomiting. (Patient not taking: Reported on 03/01/2021) 5 tablet 0   No facility-administered medications prior to visit.    No Known Allergies  ROS Review of Systems  Cardiovascular:  Positive for leg swelling.  Neurological:  Positive for light-headedness.  All other systems reviewed and are negative.    Objective:    Physical Exam Vitals reviewed.  Constitutional:      Appearance: She is obese.  HENT:     Right Ear: Tympanic membrane and external ear normal.     Left Ear: Tympanic membrane and external ear normal.     Nose: Nose normal.  Eyes:     Extraocular Movements: Extraocular movements intact.     Pupils: Pupils are equal, round, and reactive to light.  Cardiovascular:     Rate and Rhythm: Normal rate and regular rhythm.  Pulmonary:     Effort: Pulmonary effort is normal.  Breath sounds: Normal breath sounds.  Abdominal:     General: Bowel sounds are normal. There is distension.     Palpations: Abdomen is soft.  Musculoskeletal:        General: Normal range of motion.     Cervical back: Normal range of motion and neck supple.  Skin:    General: Skin is warm and dry.  Neurological:     Mental Status: She is alert and oriented to person, place, and time.  Psychiatric:        Mood and Affect: Mood normal.        Behavior: Behavior normal.        Thought Content: Thought content normal.        Judgment: Judgment normal.   BP 113/73   Pulse 81   Temp (!) 97.3 F (36.3 C)   Resp 16   Wt 222 lb (100.7 kg)   LMP 02/16/2021 (Approximate)   SpO2 99%   BMI 34.77 kg/m  Wt Readings  from Last 3 Encounters:  03/01/21 222 lb (100.7 kg)  11/29/20 240 lb 3.2 oz (109 kg)  09/22/20 235 lb (106.6 kg)     Health Maintenance Due  Topic Date Due   COVID-19 Vaccine (1) Never done   HPV VACCINES (1 - 2-dose series) Never done   Hepatitis C Screening  Never done       Topic Date Due   HPV VACCINES (1 - 2-dose series) Never done    No results found for: TSH Lab Results  Component Value Date   WBC 5.3 01/08/2021   HGB 14.5 01/08/2021   HCT 45.1 01/08/2021   MCV 92.4 01/08/2021   PLT 185 01/08/2021   Lab Results  Component Value Date   NA 135 01/08/2021   K 4.3 01/08/2021   CO2 26 01/08/2021   GLUCOSE 105 (H) 01/08/2021   BUN 5 (L) 01/08/2021   CREATININE 0.74 01/08/2021   BILITOT 0.7 01/08/2021   ALKPHOS 43 01/08/2021   AST 26 01/08/2021   ALT 23 01/08/2021   PROT 8.2 (H) 01/08/2021   ALBUMIN 4.1 01/08/2021   CALCIUM 9.1 01/08/2021   ANIONGAP 8 01/08/2021   No results found for: CHOL No results found for: HDL No results found for: LDLCALC No results found for: TRIG No results found for: CHOLHDL No results found for: LKTG2B    Assessment & Plan:  Diagnoses and all orders for this visit:  Tobacco dependence - I have recommended complete cessation of tobacco use. I have discussed various options available for assistance with tobacco cessation including over the counter methods (Nicotine gum, patch and lozenges). We also discussed prescription options (Chantix, Nicotine Inhaler / Nasal Spray). The patient is not interested in pursuing any prescription tobacco cessation options at this time. - Patient declines at this time.   Localized edema Compression stockings can keep your legs from getting tired and achy. They can also ease swelling in your feet and ankles as well as help prevent and treat spider and varicose veins. They may even stop you from feeling light-headed or dizzy when you stand up  Blood pressure check No medication prescribed at this  time continue lifestyle modifications   No orders of the defined types were placed in this encounter.   Follow-up: Return in about 6 months (around 08/31/2021) for ck up/bp/wt.    Grayce Sessions, NP

## 2021-03-01 NOTE — Progress Notes (Signed)
Follow up BP

## 2021-05-02 ENCOUNTER — Ambulatory Visit (INDEPENDENT_AMBULATORY_CARE_PROVIDER_SITE_OTHER): Payer: Medicaid Other | Admitting: Primary Care

## 2021-05-24 ENCOUNTER — Ambulatory Visit (INDEPENDENT_AMBULATORY_CARE_PROVIDER_SITE_OTHER): Payer: Medicaid Other | Admitting: Primary Care

## 2021-05-24 ENCOUNTER — Telehealth (INDEPENDENT_AMBULATORY_CARE_PROVIDER_SITE_OTHER): Payer: Self-pay

## 2021-05-24 NOTE — Telephone Encounter (Signed)
Copied from CRM [id number]. Topic: Appointment Scheduling - Scheduling Inquiry for Clinic >> May 24, 2021 11:09 AM Crist Infante wrote: Reason for CRM: pt ha appt today at 2:50 for STD testing.  However, she cannot make that time.  Would like to know if you will allow her to come in for the testing later today, because next available appt is 10/11   Attempted to reach patient to offer a 3:50 appointment for 9/20. Her phone rings busy. Tried calling 3 times. Maryjean Morn, CMA

## 2021-06-14 ENCOUNTER — Ambulatory Visit (INDEPENDENT_AMBULATORY_CARE_PROVIDER_SITE_OTHER): Payer: Medicaid Other | Admitting: Primary Care

## 2021-07-23 IMAGING — DX DG HAND COMPLETE 3+V*L*
3 series · 3 of 3 positions shown · non-contrast
Comparison: None.

CLINICAL DATA: Hand pain

EXAM:
LEFT HAND - COMPLETE 3+ VIEW

[hand pa]
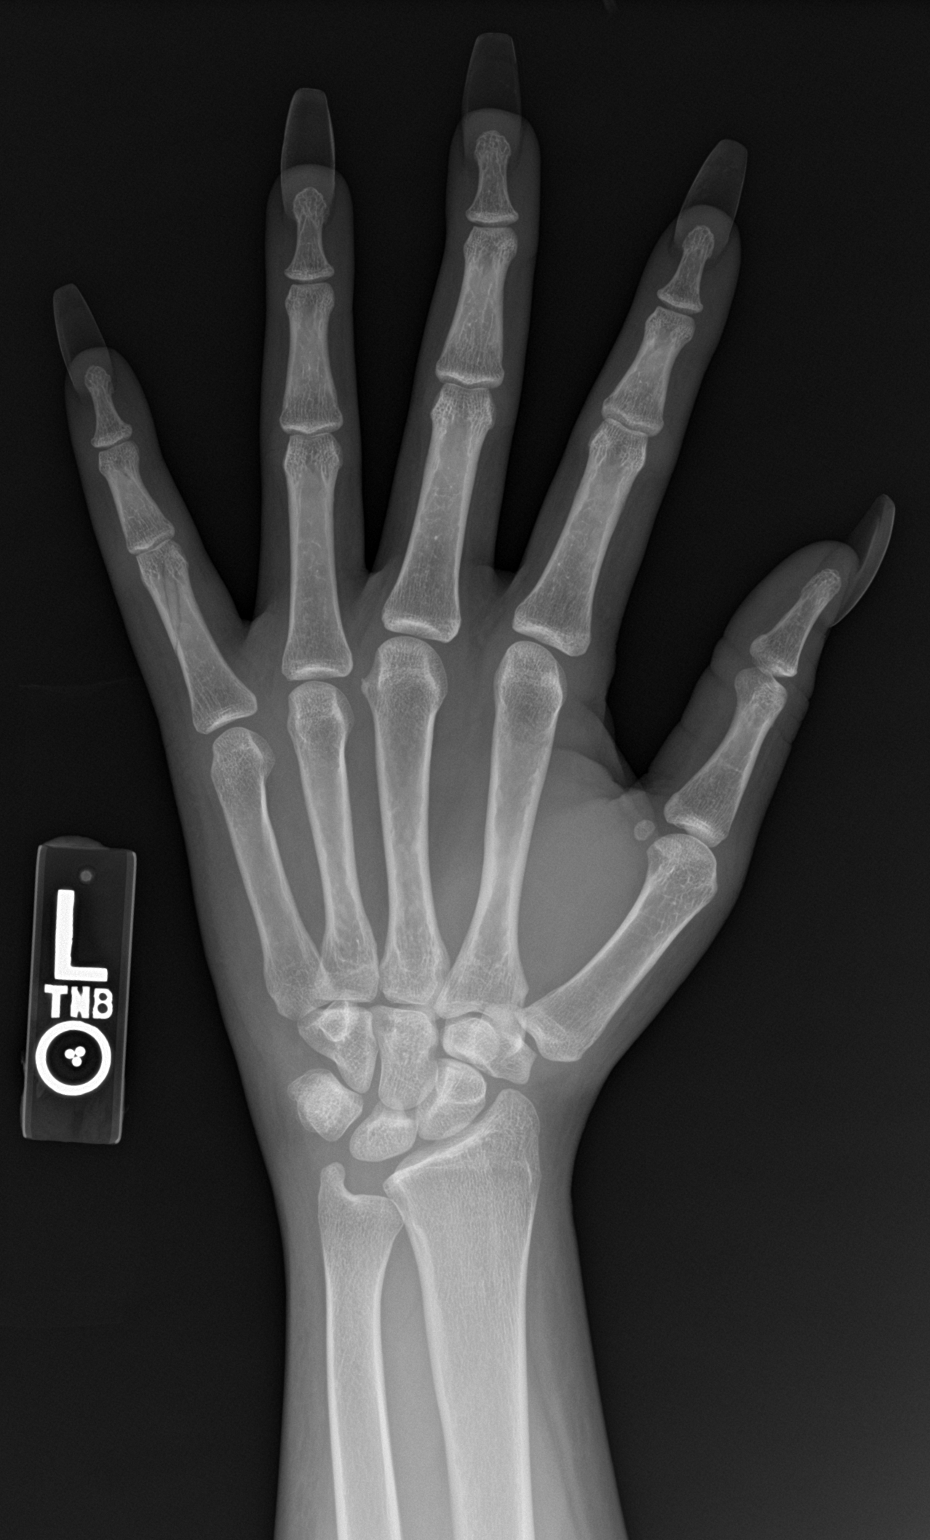

[hand obl]
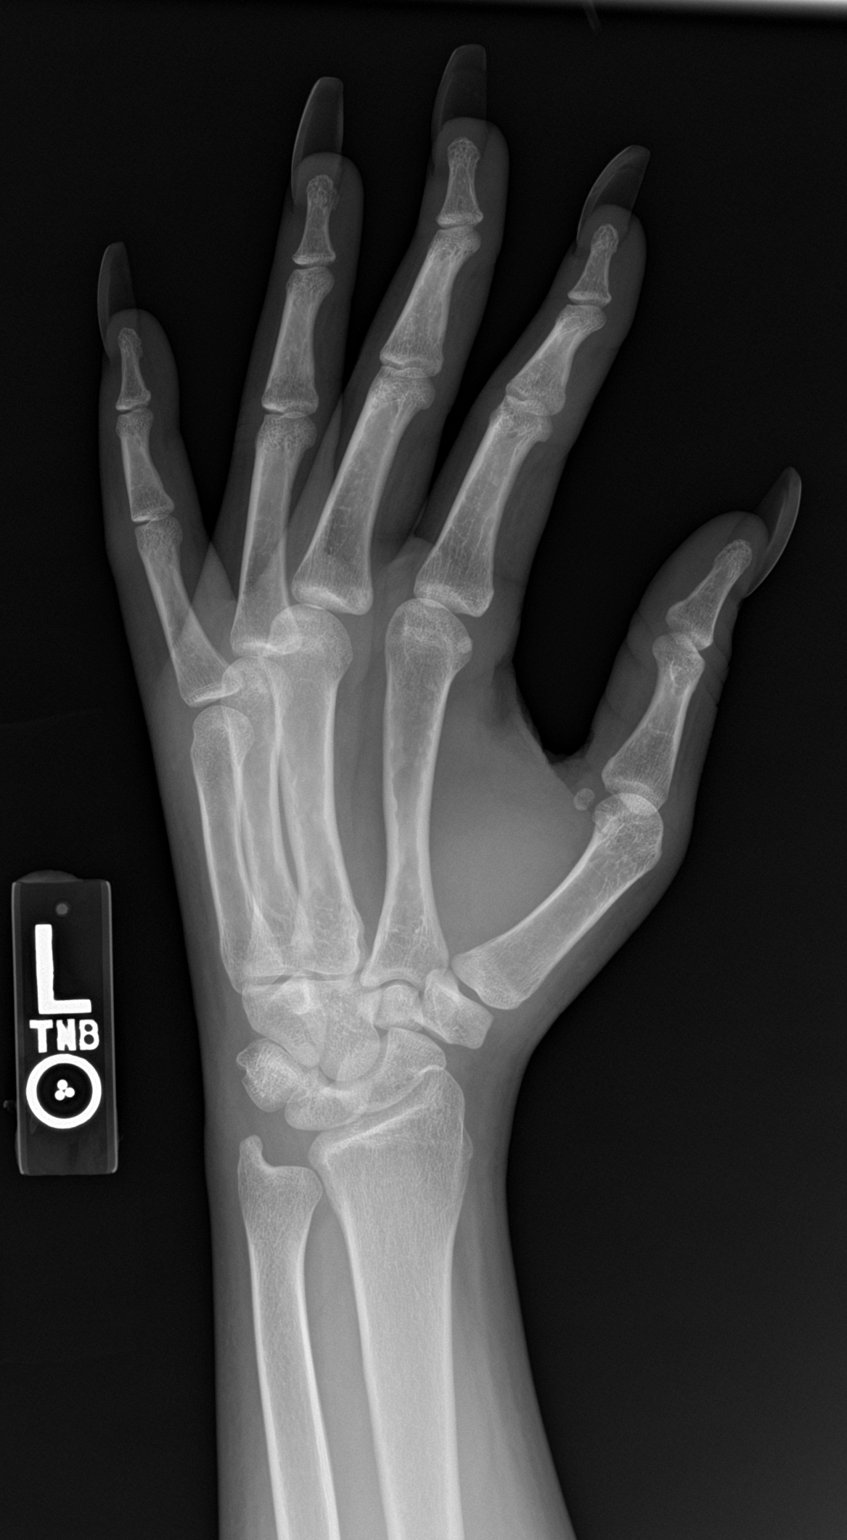

[hand lat]
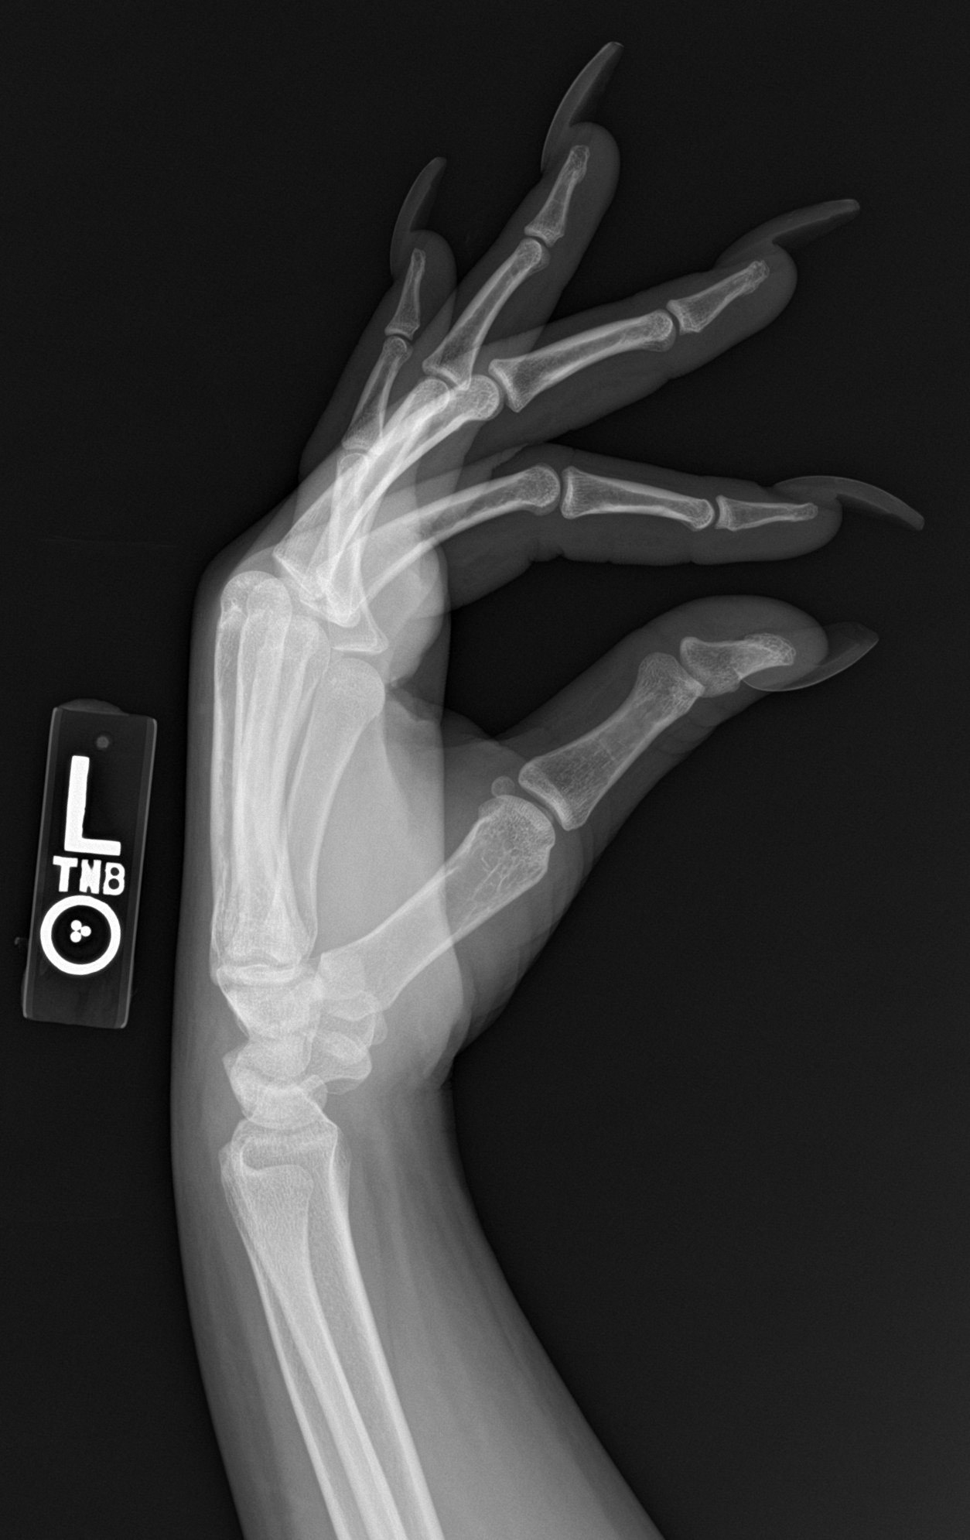

[3 of 3 positions shown; findings below may reference images not displayed]

FINDINGS: There is a nondisplaced, oblique fracture of the distal aspect of
the fifth proximal phalanx, which does not definitely extend into
the proximal interphalangeal joint. No other fracture or dislocation
of the left hand. Joint spaces are well preserved. Soft tissues are
unremarkable.
IMPRESSION: There is a nondisplaced, oblique fracture of the distal aspect of
the fifth proximal phalanx, which does not definitely extend into
the proximal interphalangeal joint. No other fracture or dislocation
of the left hand.

## 2021-08-31 ENCOUNTER — Ambulatory Visit (INDEPENDENT_AMBULATORY_CARE_PROVIDER_SITE_OTHER): Payer: Medicaid Other | Admitting: Primary Care

## 2021-12-23 ENCOUNTER — Other Ambulatory Visit (INDEPENDENT_AMBULATORY_CARE_PROVIDER_SITE_OTHER): Payer: Self-pay | Admitting: Primary Care

## 2021-12-23 ENCOUNTER — Other Ambulatory Visit: Payer: Self-pay

## 2021-12-23 MED ORDER — ALBUTEROL SULFATE HFA 108 (90 BASE) MCG/ACT IN AERS
INHALATION_SPRAY | RESPIRATORY_TRACT | 1 refills | Status: DC
  Filled 2021-12-23 – 2022-03-09 (×2): qty 18, 25d supply, fill #0

## 2021-12-29 ENCOUNTER — Other Ambulatory Visit: Payer: Self-pay

## 2022-01-25 ENCOUNTER — Encounter (INDEPENDENT_AMBULATORY_CARE_PROVIDER_SITE_OTHER): Payer: Self-pay | Admitting: Primary Care

## 2022-01-25 ENCOUNTER — Other Ambulatory Visit (HOSPITAL_COMMUNITY)
Admission: RE | Admit: 2022-01-25 | Discharge: 2022-01-25 | Disposition: A | Discharge status: Home / Self Care | Payer: Medicaid Other | Source: Ambulatory Visit | Attending: Primary Care | Admitting: Primary Care

## 2022-01-25 ENCOUNTER — Ambulatory Visit (INDEPENDENT_AMBULATORY_CARE_PROVIDER_SITE_OTHER): Payer: Self-pay | Admitting: Primary Care

## 2022-01-25 VITALS — BP 139/76 | HR 87 | Temp 98.5°F | Ht 67.0 in | Wt 189.0 lb

## 2022-01-25 DIAGNOSIS — N979 Female infertility, unspecified: Secondary | ICD-10-CM | POA: Diagnosis present

## 2022-01-25 DIAGNOSIS — Z113 Encounter for screening for infections with a predominantly sexual mode of transmission: Secondary | ICD-10-CM

## 2022-01-25 NOTE — Progress Notes (Signed)
Would levels checked to see if she can have kids

## 2022-01-26 LAB — FSH/LH
FSH: 3.2 m[IU]/mL
LH: 7.2 m[IU]/mL

## 2022-01-31 ENCOUNTER — Encounter (INDEPENDENT_AMBULATORY_CARE_PROVIDER_SITE_OTHER): Payer: Self-pay | Admitting: Primary Care

## 2022-01-31 LAB — CERVICOVAGINAL ANCILLARY ONLY
Bacterial Vaginitis (gardnerella): POSITIVE — AB
Candida Glabrata: NEGATIVE
Candida Vaginitis: NEGATIVE
Chlamydia: NEGATIVE
Comment: NEGATIVE
Comment: NEGATIVE
Comment: NEGATIVE
Comment: NEGATIVE
Comment: NEGATIVE
Comment: NORMAL
Neisseria Gonorrhea: NEGATIVE
Trichomonas: POSITIVE — AB

## 2022-02-01 ENCOUNTER — Other Ambulatory Visit (INDEPENDENT_AMBULATORY_CARE_PROVIDER_SITE_OTHER): Payer: Self-pay | Admitting: Primary Care

## 2022-02-01 ENCOUNTER — Other Ambulatory Visit: Payer: Self-pay

## 2022-02-01 MED ORDER — METRONIDAZOLE 500 MG PO TABS
500.0000 mg | ORAL_TABLET | Freq: Two times a day (BID) | ORAL | 0 refills | Status: DC
  Filled 2022-02-01 – 2022-03-09 (×2): qty 14, 7d supply, fill #0

## 2022-02-04 NOTE — Progress Notes (Signed)
  [facility]  [name], is a 26 y.o. female  GDJ:[id number]  QQI:[id number]  DOB - 28-Oct-1995  Chief Complaint  Patient presents with   Exposure to STD       Subjective:   Pamela Wong is a 26 y.o. female here today for a follow up visit.  She was concerned that she may have been exposed to a STD and requesting testing.  She is also concerned if she can conceive.  Patient has No headache, No chest pain, No abdominal pain - No Nausea, No new weakness tingling or numbness, No Cough - shortness of breath  No problems updated.  No Known Allergies  Past Medical History:  Diagnosis Date   Asthma     Current Outpatient Medications on File Prior to Visit  Medication Sig Dispense Refill   albuterol (VENTOLIN HFA) 108 (90 Base) MCG/ACT inhaler INHALE 2 PUFFS INTO THE LUNGS EVERY 6 (SIX) HOURS AS NEEDED FOR WHEEZING. (Patient not taking: Reported on 01/25/2022) 18 g 1   No current facility-administered medications on file prior to visit.    Objective:   Vitals:   01/25/22 1551  BP: 139/76  Pulse: 87  Temp: 98.5 F (36.9 C)  TempSrc: Oral  SpO2: 97%  Weight: 189 lb (85.7 kg)  Height: 5\' 7"  (1.702 m)    Exam General appearance : Awake, alert, not in any distress. Speech Clear. Not toxic looking HEENT: Atraumatic and Normocephalic, pupils equally reactive to light and accomodation Neck: Supple, no JVD. No cervical lymphadenopathy.  Chest: Good air entry bilaterally, no added sounds  CVS: S1 S2 regular, no murmurs.  Abdomen: Bowel sounds present, Non tender and not distended with no gaurding, rigidity or rebound. Extremities: B/L Lower Ext shows no edema, both legs are warm to touch Neurology: Awake alert, and oriented X 3, CN II-XII intact, Non focal Skin: No Rash  Data Review No results found for: HGBA1C  Assessment & Plan   1. Screen for STD (sexually transmitted disease) - Cervicovaginal ancillary only  2. Infertility, female -  FSH/LH - Cervicovaginal ancillary only    Patient have been counseled extensively about nutrition and exercise. Other issues discussed during this visit include: low cholesterol diet, weight control and daily exercise, foot care, annual eye examinations at Ophthalmology, importance of adherence with medications and regular follow-up. We also discussed long term complications of uncontrolled diabetes and hypertension.   No follow-ups on file.  The patient was given clear instructions to go to ER or return to medical center if symptoms don't improve, worsen or new problems develop. The patient verbalized understanding. The patient was told to call to get lab results if they haven't heard anything in the next week.   This note has been created with . Any transcriptional errors are unintentional.   Education officer, environmental, NP 02/04/2022, 11:26 PM

## 2022-02-08 ENCOUNTER — Other Ambulatory Visit: Payer: Self-pay

## 2022-03-09 ENCOUNTER — Other Ambulatory Visit: Payer: Self-pay

## 2022-03-15 ENCOUNTER — Other Ambulatory Visit: Payer: Self-pay

## 2022-07-09 ENCOUNTER — Encounter (INDEPENDENT_AMBULATORY_CARE_PROVIDER_SITE_OTHER): Payer: Self-pay | Admitting: Primary Care

## 2022-07-21 ENCOUNTER — Encounter (HOSPITAL_COMMUNITY): Payer: Self-pay | Admitting: Emergency Medicine

## 2022-07-21 ENCOUNTER — Ambulatory Visit (HOSPITAL_COMMUNITY)
Admission: EM | Admit: 2022-07-21 | Discharge: 2022-07-21 | Disposition: A | Discharge status: Home / Self Care | Payer: Medicaid Other | Attending: Emergency Medicine | Admitting: Emergency Medicine

## 2022-07-21 DIAGNOSIS — L02411 Cutaneous abscess of right axilla: Secondary | ICD-10-CM

## 2022-07-21 MED ORDER — DOXYCYCLINE HYCLATE 100 MG PO CAPS: 100.0000 mg | ORAL_CAPSULE | Freq: Two times a day (BID) | ORAL | 0 refills | Status: AC

## 2022-07-21 NOTE — Discharge Instructions (Addendum)
Please take medication as prescribed. Take with food to avoid upset stomach.  Avoid shaving in the area and try aluminum free deoderant   Please return if symptoms don't improve after 3-4 days on the medication

## 2022-07-21 NOTE — ED Triage Notes (Signed)
Pt c/o painful abscess in right axilla that gotten worse over past 3 days. Denies drainage.

## 2022-07-21 NOTE — ED Provider Notes (Signed)
[facility]    CSN: [id number] Arrival date & time: 07/21/22  1643     History   Chief Complaint Chief Complaint  Patient presents with   Abscess    HPI DESHAWNDA ACREY is a 26 y.o. female.  Presents with 3-day history of abscess in the right axilla Reports worsening yesterday and today Tender to touch, denies any drainage No fevers  She has history of abscess but never this large  Shaves the area, used to use Dove deodorant but has not used any in months  Past Medical History:  Diagnosis Date   Asthma     There are no problems to display for this patient.   History reviewed. No pertinent surgical history.  OB History   No obstetric history on file.      Home Medications    Prior to Admission medications   Medication Sig Start Date End Date Taking? Authorizing Provider  doxycycline (VIBRAMYCIN) 100 MG capsule Take 1 capsule (100 mg total) by mouth 2 (two) times daily for 7 days. 07/21/22 07/28/22 Yes Wilkie Zenon, Lurena Joiner, PA-C    Family History Family History  Problem Relation Age of Onset   Healthy Mother     Social History Social History   Tobacco Use   Smoking status: Former    Years: 10.00    Types: Cigarettes    Quit date: 01/2016    Years since quitting: 6.5   Smokeless tobacco: Never  Substance Use Topics   Alcohol use: No   Drug use: No     Allergies   Patient has no known allergies.   Review of Systems Review of Systems Per HPI  Physical Exam Triage Vital Signs ED Triage Vitals  Enc Vitals Group     BP 07/21/22 1812 118/70     Pulse Rate 07/21/22 1812 70     Resp 07/21/22 1812 17     Temp 07/21/22 1812 98.3 F (36.8 C)     Temp Source 07/21/22 1812 Oral     SpO2 07/21/22 1812 97 %     Weight --      Height --      Head Circumference --      Peak Flow --      Pain Score 07/21/22 1811 10     Pain Loc --      Pain Edu? --      Excl. in GC? --    No data found.  Updated Vital Signs BP 118/70 (BP  Location: Left Arm)   Pulse 70   Temp 98.3 F (36.8 C) (Oral)   Resp 17   LMP 07/16/2022   SpO2 97%    Physical Exam Vitals and nursing note reviewed.  Constitutional:      General: She is not in acute distress. HENT:     Mouth/Throat:     Pharynx: Oropharynx is clear.  Cardiovascular:     Rate and Rhythm: Normal rate and regular rhythm.  Pulmonary:     Effort: Pulmonary effort is normal.  Skin:    General: Skin is warm and dry.     Findings: Abscess and erythema present.     Comments: Fluctuant golf-ball sized abscess in the right axilla, tender.  Some erythema, no drainage  Neurological:     Mental Status: She is alert and oriented to person, place, and time.     UC Treatments / Results  Labs (all labs ordered are listed, but only abnormal results are displayed) Labs Reviewed -  No data to display  EKG   Radiology No results found.  Procedures Procedures (including critical care time)  Medications Ordered in UC Medications - No data to display  Initial Impression / Assessment and Plan / UC Course  I have reviewed the triage vital signs and the nursing notes.  Pertinent labs & imaging results that were available during my care of the patient were reviewed by me and considered in my medical decision making (see chart for details).  Recommended I&D in clinic given size, fluctuance, pain Patient does not like needles and would like to avoid drainage at this time. Will try doxycycline twice daily for 7 days in the meantime.  Discussed using Tylenol or ibuprofen for pain, warm compress to the area, not shaving, avoiding aluminum deodorants.  Recommend return if the antibiotic does not help and we will attempt drainage at that time. She has PCP and will see them for derm referral if needed Patient agrees to plan  Final Clinical Impressions(s) / UC Diagnoses   Final diagnoses:  Abscess of axilla, right     Discharge Instructions      Please take medication  as prescribed. Take with food to avoid upset stomach.  Avoid shaving in the area and try aluminum free deoderant   Please return if symptoms don't improve after 3-4 days on the medication     ED Prescriptions     Medication Sig Dispense Auth. Provider   doxycycline (VIBRAMYCIN) 100 MG capsule Take 1 capsule (100 mg total) by mouth 2 (two) times daily for 7 days. 14 capsule Linwood Gullikson, Lurena Joiner, PA-C      PDMP not reviewed this encounter.   Kathrine Haddock 07/21/22 1839

## 2022-07-22 ENCOUNTER — Emergency Department (HOSPITAL_COMMUNITY)
Admission: EM | Admit: 2022-07-22 | Discharge: 2022-07-22 | Disposition: A | Discharge status: Home / Self Care | Payer: Self-pay | Attending: Emergency Medicine | Admitting: Emergency Medicine

## 2022-07-22 ENCOUNTER — Encounter (HOSPITAL_COMMUNITY): Payer: Self-pay | Admitting: Emergency Medicine

## 2022-07-22 ENCOUNTER — Other Ambulatory Visit: Payer: Self-pay

## 2022-07-22 DIAGNOSIS — L02413 Cutaneous abscess of right upper limb: Secondary | ICD-10-CM | POA: Insufficient documentation

## 2022-07-22 DIAGNOSIS — L0291 Cutaneous abscess, unspecified: Secondary | ICD-10-CM

## 2022-07-22 MED ORDER — OXYCODONE-ACETAMINOPHEN 5-325 MG PO TABS
1.0000 | ORAL_TABLET | Freq: Once | ORAL | Status: AC
  Administered 2022-07-22: 1 via ORAL
  Filled 2022-07-22: qty 1

## 2022-07-22 MED ORDER — LIDOCAINE-EPINEPHRINE-TETRACAINE (LET) TOPICAL GEL
3.0000 mL | Freq: Once | TOPICAL | Status: AC
  Administered 2022-07-22: 3 mL via TOPICAL
  Filled 2022-07-22: qty 3

## 2022-07-22 MED ORDER — KETOROLAC TROMETHAMINE 30 MG/ML IJ SOLN
30.0000 mg | Freq: Once | INTRAMUSCULAR | Status: AC
  Administered 2022-07-22: 30 mg via INTRAMUSCULAR
  Filled 2022-07-22: qty 1

## 2022-07-22 MED ORDER — LIDOCAINE HCL (PF) 1 % IJ SOLN
5.0000 mL | Freq: Once | INTRAMUSCULAR | Status: AC
  Administered 2022-07-22: 5 mL
  Filled 2022-07-22: qty 30

## 2022-07-22 NOTE — ED Provider Notes (Signed)
[facility]-EMERGENCY DEPT Provider Note   CSN: [id number] Arrival date & time: 07/22/22  0356     History  Chief Complaint  Patient presents with   Abscess    Pamela Wong is a 26 y.o. female.  HPI   Patient with medical history including including hidradenitis  presents with complaints of an abscess underneath her right axilla, states that she noticed about 3 days ago, has got larger and more painful, no drainage or discharge at this time no associate fevers or chills, states that she went to urgent care and they recommend I&D but patient deferred and she was placed on antibiotics she states she is taken 2 days worth of antibiotics without much relief, she is not immunocompromise, today on her tetanus shot, she states that she has had these in the past but these typically dry in their own.    Home Medications Prior to Admission medications   Medication Sig Start Date End Date Taking? Authorizing Provider  doxycycline (VIBRAMYCIN) 100 MG capsule Take 1 capsule (100 mg total) by mouth 2 (two) times daily for 7 days. 07/21/22 07/28/22  Rising, Lurena Joiner, PA-C      Allergies    Patient has no known allergies.    Review of Systems   Review of Systems  Constitutional:  Negative for chills and fever.  Respiratory:  Negative for shortness of breath.   Cardiovascular:  Negative for chest pain.  Gastrointestinal:  Negative for abdominal pain.  Skin:  Positive for wound.  Neurological:  Negative for headaches.    Physical Exam Updated Vital Signs BP (!) 160/93 (BP Location: Right Arm)   Pulse 78   Temp 98.8 F (37.1 C)   Resp 18   Ht 5\' 6"  (1.676 m)   Wt 86.2 kg   LMP 07/16/2022   SpO2 98%   BMI 30.67 kg/m  Physical Exam Vitals and nursing note reviewed.  Constitutional:      General: She is not in acute distress.    Appearance: She is not ill-appearing.  HENT:     Head: Normocephalic and atraumatic.     Nose: No congestion.  Eyes:      Conjunctiva/sclera: Conjunctivae normal.  Cardiovascular:     Rate and Rhythm: Normal rate and regular rhythm.     Pulses: Normal pulses.  Skin:    General: Skin is warm and dry.     Comments: Has a noted abscess underneath her right axilla, measures about the size of a golf ball, slight erythema around the area, fluctuant induration present, no drainage or discharge at this time.  Neurological:     Mental Status: She is alert.  Psychiatric:        Mood and Affect: Mood normal.     ED Results / Procedures / Treatments   Labs (all labs ordered are listed, but only abnormal results are displayed) Labs Reviewed - No data to display  EKG None  Radiology No results found.  Procedures .13/12/2023Incision and Drainage  Date/Time: 07/22/2022 5:28 AM  Performed by: 07/24/2022, PA-C Authorized by: Carroll Sage, PA-C   Consent:    Consent obtained:  Verbal   Consent given by:  Patient   Risks discussed:  Bleeding, incomplete drainage, pain, damage to other organs and infection   Alternatives discussed:  No treatment, delayed treatment, alternative treatment, observation and referral Universal protocol:    Patient identity confirmed:  Verbally with patient Location:    Type:  Abscess  Size:  3cm   Location:  Upper extremity   Upper extremity location:  Arm   Arm location:  R upper arm Pre-procedure details:    Skin preparation:  Antiseptic wash Sedation:    Sedation type:  None Anesthesia:    Anesthesia method:  Topical application and local infiltration   Topical anesthetic:  LET   Local anesthetic:  Lidocaine 1% w/o epi Procedure type:    Complexity:  Complex Procedure details:    Ultrasound guidance: no     Needle aspiration: no     Incision types:  Stab incision   Incision depth:  Subcutaneous   Wound management:  Probed and deloculated   Drainage:  Bloody and purulent   Drainage amount:  Copious   Packing materials:  1/4 in iodoform gauze   Amount 1/4"  iodoform:  4cm Post-procedure details:    Procedure completion:  Tolerated well, no immediate complications     Medications Ordered in ED Medications  oxyCODONE-acetaminophen (PERCOCET/ROXICET) 5-325 MG per tablet 1 tablet (1 tablet Oral Given 07/22/22 0455)  lidocaine (PF) (XYLOCAINE) 1 % injection 5 mL (5 mLs Infiltration Given 07/22/22 0458)  lidocaine-EPINEPHrine-tetracaine (LET) topical gel (3 mLs Topical Given 07/22/22 0456)    ED Course/ Medical Decision Making/ A&P                           Medical Decision Making Risk Prescription drug management.   This patient presents to the ED for concern of abscess, this involves an extensive number of treatment options, and is a complaint that carries with it a high risk of complications and morbidity.  The differential diagnosis includes cellulitis, abscess, necrotizing fasciitis    Additional history obtained:  Additional history obtained from N/A External records from outside source obtained and reviewed including previous ER notes   Co morbidities that complicate the patient evaluation  N/A   Social Determinants of Health:  N/A    Lab Tests:  I Ordered, and personally interpreted labs.  The pertinent results include: N/A   Imaging Studies ordered:  I ordered imaging studies including N/A I independently visualized and interpreted imaging which showed N/A I agree with the radiologist interpretation   Cardiac Monitoring:  The patient was maintained on a cardiac monitor.  I personally viewed and interpreted the cardiac monitored which showed an underlying rhythm of: N/A   Medicines ordered and prescription drug management:  I ordered medication including lidocaine I have reviewed the patients home medicines and have made adjustments as needed  Critical Interventions:  N/A   Reevaluation:  Patient has an abscess, recommend I&D for improved healing and she is agreement this plan  Patient tolerated  the procedure well and she agreement with discharge at this time   Consultations Obtained:  N/a    Test Considered:  N/a    Rule out Suspicion for necrotizing fasciitis is low at this time presentation is atypical of etiology.  Doubt deep tissue infection as abscess was superficial.    Dispostion and problem list  After consideration of the diagnostic results and the patients response to treatment, I feel that the patent would benefit from discharge.  Abscess-packings in place, she is currently on doxycycline we will have her continue with this, we will have her come back in 48 hours for packing removal as well as a wound check.            Final Clinical Impression(s) / ED Diagnoses Final diagnoses:  Abscess    Rx / DC Orders ED Discharge Orders     None         Barnie Del 07/22/22 0533    Maia Plan, MD 08/02/22 2035

## 2022-07-22 NOTE — Discharge Instructions (Addendum)
You have an abscess which we have drained, I have placed packing it, please do not remove, if the packing does fall out do not attempt to place it back in.  Please apply warm compresses to the area do this twice daily, then try to massage the area to expel any drainage.  I recommend over-the-counter pain medication like ibuprofen and Tylenol.  I want to continue with the doxycycline that was prescribed to you.  I want you to come back to this department urgent care for reassessment in the next 48 hours.  Come back to the emergency department if you develop chest pain, shortness of breath, severe abdominal pain, uncontrolled nausea, vomiting, diarrhea.

## 2022-07-22 NOTE — ED Triage Notes (Signed)
  Patient comes in with hidradenitis suppurativa in R axillary area.  Patient was seen at [facility] yesterday and given antibiotics.  Was told to be seen if pain got worse.  Pain 10/10, stabbing.

## 2022-07-24 ENCOUNTER — Other Ambulatory Visit: Payer: Self-pay

## 2022-07-24 ENCOUNTER — Encounter (HOSPITAL_COMMUNITY): Payer: Self-pay | Admitting: Emergency Medicine

## 2022-07-24 ENCOUNTER — Emergency Department (HOSPITAL_COMMUNITY)
Admission: EM | Admit: 2022-07-24 | Discharge: 2022-07-24 | Disposition: A | Discharge status: Home / Self Care | Payer: Self-pay | Attending: Emergency Medicine | Admitting: Emergency Medicine

## 2022-07-24 DIAGNOSIS — Z5189 Encounter for other specified aftercare: Secondary | ICD-10-CM

## 2022-07-24 DIAGNOSIS — Z4800 Encounter for change or removal of nonsurgical wound dressing: Secondary | ICD-10-CM | POA: Insufficient documentation

## 2022-07-24 DIAGNOSIS — L02411 Cutaneous abscess of right axilla: Secondary | ICD-10-CM | POA: Insufficient documentation

## 2022-07-24 NOTE — ED Triage Notes (Signed)
Patient presents to have packing removed from under her right arm. She was told to come back if packing did not fall out on its own.

## 2022-07-24 NOTE — ED Provider Notes (Signed)
[facility]-EMERGENCY DEPT Provider Note   CSN: [id number] Arrival date & time: 07/24/22  1515     History  No chief complaint on file.   Pamela Wong is a 26 y.o. female.  Patient presents to the emergency department today for a wound check and removal of wound packing.  Patient was seen on November 18 at the same emergency department and had an abscess drained that was underneath her right axilla.  The wound was packed with quarter inch iodoform gauze.  The patient was also prescribed doxycycline for antibiotic coverage.  She was instructed to return in 2 days for a wound check and removal of packing.  She denies any new complaints at this time.  Denies any large amounts of drainage.  Denies any fevers or systemic symptoms.  Past medical history significant for asthma, possible hidradenitis  HPI     Home Medications Prior to Admission medications   Medication Sig Start Date End Date Taking? Authorizing Provider  doxycycline (VIBRAMYCIN) 100 MG capsule Take 1 capsule (100 mg total) by mouth 2 (two) times daily for 7 days. 07/21/22 07/28/22  Rising, Lurena Joiner, PA-C      Allergies    Patient has no known allergies.    Review of Systems   Review of Systems  Skin:  Positive for wound.    Physical Exam Updated Vital Signs BP 127/86   Pulse 63   Temp 98.5 F (36.9 C) (Oral)   Resp 17   LMP 07/16/2022   SpO2 100%  Physical Exam HENT:     Head: Normocephalic and atraumatic.  Eyes:     Pupils: Pupils are equal, round, and reactive to light.  Cardiovascular:     Rate and Rhythm: Normal rate.  Pulmonary:     Effort: Pulmonary effort is normal. No respiratory distress.  Abdominal:     General: Abdomen is flat.  Musculoskeletal:        General: No signs of injury.     Cervical back: Normal range of motion.  Skin:    General: Skin is warm and dry.     Comments: Healing, open abscess noted in the right axilla.  No spreading erythema.  Packing in  place initially.  Neurological:     Mental Status: She is alert.  Psychiatric:        Speech: Speech normal.        Behavior: Behavior normal.     ED Results / Procedures / Treatments   Labs (all labs ordered are listed, but only abnormal results are displayed) Labs Reviewed - No data to display  EKG None  Radiology No results found.  Procedures Procedures    Medications Ordered in ED Medications - No data to display  ED Course/ Medical Decision Making/ A&P                           Medical Decision Making  Patient presents with chief complaint of wound check and wound packing removal.  I reviewed the emergency department notes from November 18 for the initial abscess with incision and drainage  There is no indication today for imaging or lab work.  I remove the packing material using forceps.  The patient tolerated the procedure without difficulty.  Approximately 20 cm of packing material was removed.  The wound remained open.  I covered it with a clean dry dressing.  The wound appears to be healing appropriately.  There is not  appear to be significant drainage at this time.  There is no spreading erythema around the wound to suggest cellulitis.  Patient may discharge home and continue the current course of doxycycline until it is complete.  Follow-up as needed with primary care provider        Final Clinical Impression(s) / ED Diagnoses Final diagnoses:  Wound check, abscess    Rx / DC Orders ED Discharge Orders     None         Pamala Duffel 07/24/22 1735    Gwyneth Sprout, MD 07/25/22 0006

## 2022-07-24 NOTE — Discharge Instructions (Signed)
The packing was removed from your abscess at today's visit.  The wound appears to be healing appropriately.  I do recommend completing the full course of doxycycline that was previously prescribed.  Please follow-up as needed with your primary care provider

## 2022-11-14 ENCOUNTER — Ambulatory Visit (INDEPENDENT_AMBULATORY_CARE_PROVIDER_SITE_OTHER): Payer: Self-pay

## 2023-01-24 ENCOUNTER — Ambulatory Visit (HOSPITAL_COMMUNITY): Payer: BLUE CROSS/BLUE SHIELD

## 2023-01-24 ENCOUNTER — Ambulatory Visit (HOSPITAL_COMMUNITY)
Admission: EM | Admit: 2023-01-24 | Discharge: 2023-01-24 | Disposition: A | Discharge status: Home / Self Care | Payer: BLUE CROSS/BLUE SHIELD | Attending: Family Medicine | Admitting: Family Medicine

## 2023-01-24 ENCOUNTER — Encounter (HOSPITAL_COMMUNITY): Payer: Self-pay

## 2023-01-24 DIAGNOSIS — M79671 Pain in right foot: Secondary | ICD-10-CM | POA: Diagnosis not present

## 2023-01-24 DIAGNOSIS — M79672 Pain in left foot: Secondary | ICD-10-CM | POA: Diagnosis not present

## 2023-01-24 DIAGNOSIS — M25571 Pain in right ankle and joints of right foot: Secondary | ICD-10-CM

## 2023-01-24 DIAGNOSIS — M25572 Pain in left ankle and joints of left foot: Secondary | ICD-10-CM

## 2023-01-24 MED ORDER — PREDNISONE 20 MG PO TABS: 40.0000 mg | ORAL_TABLET | Freq: Every day | ORAL | 0 refills | Status: AC

## 2023-01-24 MED ORDER — ALBUTEROL SULFATE HFA 108 (90 BASE) MCG/ACT IN AERS: 2.0000 | INHALATION_SPRAY | RESPIRATORY_TRACT | 0 refills | Status: DC | PRN

## 2023-01-24 MED ORDER — ALBUTEROL SULFATE (2.5 MG/3ML) 0.083% IN NEBU
2.5000 mg | INHALATION_SOLUTION | Freq: Once | RESPIRATORY_TRACT | Status: AC
  Administered 2023-01-24: 2.5 mg via RESPIRATORY_TRACT

## 2023-01-24 MED ORDER — IBUPROFEN 600 MG PO TABS: 600.0000 mg | ORAL_TABLET | Freq: Three times a day (TID) | ORAL | 0 refills | Status: DC | PRN

## 2023-01-24 MED ORDER — ALBUTEROL SULFATE (2.5 MG/3ML) 0.083% IN NEBU
INHALATION_SOLUTION | RESPIRATORY_TRACT | Status: AC
  Filled 2023-01-24: qty 3

## 2023-01-24 NOTE — Discharge Instructions (Signed)
  X-ray did not show any broken bones  Albuterol inhaler--do 2 puffs every 4 hours as needed for shortness of breath or wheezing  Take prednisone 20 mg--2 daily for 5 days  Take ibuprofen 800 mg--1 tab every 8 hours as needed for pain. Start taking this after your are done with the prednisone
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
## 2023-02-27 ENCOUNTER — Other Ambulatory Visit (HOSPITAL_COMMUNITY)
Admission: RE | Admit: 2023-02-27 | Discharge: 2023-02-27 | Disposition: A | Discharge status: Home / Self Care | Payer: BLUE CROSS/BLUE SHIELD | Source: Ambulatory Visit | Attending: Primary Care | Admitting: Primary Care

## 2023-02-27 ENCOUNTER — Other Ambulatory Visit (INDEPENDENT_AMBULATORY_CARE_PROVIDER_SITE_OTHER): Payer: Self-pay | Admitting: Primary Care

## 2023-02-27 ENCOUNTER — Ambulatory Visit (INDEPENDENT_AMBULATORY_CARE_PROVIDER_SITE_OTHER): Payer: BLUE CROSS/BLUE SHIELD | Admitting: Primary Care

## 2023-02-27 ENCOUNTER — Encounter (INDEPENDENT_AMBULATORY_CARE_PROVIDER_SITE_OTHER): Payer: Self-pay | Admitting: Primary Care

## 2023-02-27 VITALS — BP 122/76 | HR 78 | Resp 16 | Ht 66.0 in | Wt 201.4 lb

## 2023-02-27 DIAGNOSIS — Z113 Encounter for screening for infections with a predominantly sexual mode of transmission: Secondary | ICD-10-CM | POA: Diagnosis present

## 2023-02-27 NOTE — Progress Notes (Signed)
[facility]   CC Testing  SUBJECTIVE:  Ms. is a Pamela Wong is a 27 y.o. female who presents with complaints of abrupt/ gradual vaginal discharge that began 3 ago.  Patient has been precipitating with recent sexual encounter or recent antibiotic use.  Patient is sexually active with # of female/ female partners.  Describes discharge as thick/ thin and white/ yellow.   Patient denies fever, chills, nausea, vomiting, abdominal or pelvic pain, urinary symptoms, genital itching, odor and/or bleeding, dyspareunia rashes or lesions.   No LMP recorded. Current birth control method: none   ROS: As per HPI.  All other pertinent ROS negative.     Past Medical History:  Diagnosis Date   Asthma    No past surgical history on file. No Known Allergies Current Outpatient Medications on File Prior to Visit  Medication Sig Dispense Refill   albuterol (VENTOLIN HFA) 108 (90 Base) MCG/ACT inhaler Inhale 2 puffs into the lungs every 4 (four) hours as needed for wheezing or shortness of breath. 1 each 0   ibuprofen (ADVIL) 600 MG tablet Take 1 tablet (600 mg total) by mouth every 8 (eight) hours as needed (pain). 15 tablet 0   No current facility-administered medications on file prior to visit.    Social History   Socioeconomic History   Marital status: Single    Spouse name: Not on file   Number of children: Not on file   Years of education: Not on file   Highest education level: Not on file  Occupational History   Not on file  Tobacco Use   Smoking status: Former    Years: 10    Types: Cigarettes    Quit date: 01/2016    Years since quitting: 7.1   Smokeless tobacco: Never  Substance and Sexual Activity   Alcohol use: No   Drug use: No   Sexual activity: Yes    Birth control/protection: None  Other Topics Concern   Not on file  Social History Narrative   Not on file   Social Determinants of Health   Financial Resource Strain: Not on file  Food  Insecurity: Not on file  Transportation Needs: Not on file  Physical Activity: Not on file  Stress: Not on file  Social Connections: Not on file  Intimate Partner Violence: Not on file   Family History  Problem Relation Age of Onset   Healthy Mother     OBJECTIVE: Blood Pressure 122/76   Pulse 78   Respiration 16   Height 5\' 6"  (1.676 m)   Weight 201 lb 6.4 oz (91.4 kg)   Oxygen Saturation 100%   Body Mass Index 32.51 kg/m   General appearance: Alert, NAD, appears stated age Head: NCAT Throat: lips, mucosa, and tongue normal; teeth and gums normal Lungs: CTA bilaterally without adventitious breath sounds Heart: regular rate and rhythm.  Radial pulses 2+ symmetrical bilaterally Back: no CVA tenderness Abdomen: soft, non-tender; bowel sounds normal; no masses or organomegaly; no guarding or rebound tenderness  swab obtained Skin: warm and dry Psychological:  Alert and cooperative. Normal mood and affect.  LABS:  Results for orders placed or performed in visit on 01/25/22  FSH/LH  Result Value Ref Range   LH 7.2 mIU/mL   FSH 3.2 mIU/mL  Cervicovaginal ancillary only  Result Value Ref Range   Neisseria Gonorrhea Negative    Chlamydia Negative    Trichomonas Positive (A)    Bacterial Vaginitis (gardnerella) Positive (A)  Candida Vaginitis Negative    Candida Glabrata Negative    Comment Normal Reference Range Candida Species - Negative    Comment Normal Reference Range Candida Galbrata - Negative    Comment Normal Reference Range Trichomonas - Negative    Comment Normal Reference Ranger Chlamydia - Negative    Comment      Normal Reference Range Neisseria Gonorrhea - Negative   Comment      Normal Reference Range Bacterial Vaginosis - Negative    ASSESSMENT & PLAN: Lizzeth was seen today for std testing.  Diagnoses and all orders for this visit:  Screen for STD (sexually transmitted disease) -     Cervicovaginal ancillary only -     HIV antibody (with  reflex)   Gwinda Passe, NP 02/27/2023 11:41 AM

## 2023-03-02 ENCOUNTER — Other Ambulatory Visit (INDEPENDENT_AMBULATORY_CARE_PROVIDER_SITE_OTHER): Payer: Self-pay | Admitting: Primary Care

## 2023-03-02 DIAGNOSIS — A599 Trichomoniasis, unspecified: Secondary | ICD-10-CM

## 2023-03-02 LAB — CERVICOVAGINAL ANCILLARY ONLY
Bacterial Vaginitis (gardnerella): NEGATIVE
Candida Glabrata: NEGATIVE
Candida Vaginitis: NEGATIVE
Chlamydia: NEGATIVE
Comment: NEGATIVE
Comment: NEGATIVE
Comment: NEGATIVE
Comment: NEGATIVE
Comment: NEGATIVE
Comment: NORMAL
Neisseria Gonorrhea: NEGATIVE
Trichomonas: POSITIVE — AB

## 2023-03-02 MED ORDER — METRONIDAZOLE 500 MG PO TABS: 500.0000 mg | ORAL_TABLET | Freq: Two times a day (BID) | ORAL | 0 refills | Status: DC

## 2023-03-05 DIAGNOSIS — N739 Female pelvic inflammatory disease, unspecified: Secondary | ICD-10-CM

## 2023-03-05 HISTORY — DX: Female pelvic inflammatory disease, unspecified: N73.9

## 2023-03-17 ENCOUNTER — Emergency Department (HOSPITAL_COMMUNITY): Payer: BLUE CROSS/BLUE SHIELD

## 2023-03-17 ENCOUNTER — Encounter (HOSPITAL_COMMUNITY): Payer: Self-pay | Admitting: Emergency Medicine

## 2023-03-17 ENCOUNTER — Emergency Department (HOSPITAL_COMMUNITY)
Admission: EM | Admit: 2023-03-17 | Discharge: 2023-03-18 | Disposition: A | Discharge status: Home / Self Care | Payer: BLUE CROSS/BLUE SHIELD | Attending: Emergency Medicine | Admitting: Emergency Medicine

## 2023-03-17 ENCOUNTER — Other Ambulatory Visit: Payer: Self-pay

## 2023-03-17 DIAGNOSIS — N938 Other specified abnormal uterine and vaginal bleeding: Secondary | ICD-10-CM | POA: Insufficient documentation

## 2023-03-17 DIAGNOSIS — N739 Female pelvic inflammatory disease, unspecified: Secondary | ICD-10-CM

## 2023-03-17 DIAGNOSIS — D72829 Elevated white blood cell count, unspecified: Secondary | ICD-10-CM | POA: Diagnosis not present

## 2023-03-17 DIAGNOSIS — E871 Hypo-osmolality and hyponatremia: Secondary | ICD-10-CM | POA: Diagnosis not present

## 2023-03-17 DIAGNOSIS — R102 Pelvic and perineal pain: Secondary | ICD-10-CM | POA: Insufficient documentation

## 2023-03-17 LAB — URINALYSIS, ROUTINE W REFLEX MICROSCOPIC
Bilirubin Urine: NEGATIVE
Glucose, UA: NEGATIVE mg/dL
Ketones, ur: 80 mg/dL — AB
Nitrite: NEGATIVE
Protein, ur: 100 mg/dL — AB
RBC / HPF: >50 RBC/hpf (ref 0–5)
Specific Gravity, Urine: 1.03 (ref 1.005–1.030)
WBC, UA: >50 WBC/hpf (ref 0–5)
pH: 5 (ref 5.0–8.0)

## 2023-03-17 LAB — CBC WITH DIFFERENTIAL/PLATELET
Abs Immature Granulocytes: 0.03 K/uL (ref 0.00–0.07)
Basophils Absolute: 0.1 K/uL (ref 0.0–0.1)
Basophils Relative: 0 %
Eosinophils Absolute: 0 K/uL (ref 0.0–0.5)
Eosinophils Relative: 0 %
HCT: 37.3 % (ref 36.0–46.0)
Hemoglobin: 12 g/dL (ref 12.0–15.0)
Immature Granulocytes: 0 %
Lymphocytes Relative: 12 %
Lymphs Abs: 1.4 K/uL (ref 0.7–4.0)
MCH: 29.6 pg (ref 26.0–34.0)
MCHC: 32.2 g/dL (ref 30.0–36.0)
MCV: 92.1 fL (ref 80.0–100.0)
Monocytes Absolute: 0.5 K/uL (ref 0.1–1.0)
Monocytes Relative: 4 %
Neutro Abs: 9.6 K/uL — ABNORMAL HIGH (ref 1.7–7.7)
Neutrophils Relative %: 84 %
Platelets: 238 K/uL (ref 150–400)
RBC: 4.05 MIL/uL (ref 3.87–5.11)
RDW: 15.2 % (ref 11.5–15.5)
WBC: 11.5 K/uL — ABNORMAL HIGH (ref 4.0–10.5)
nRBC: 0 % (ref 0.0–0.2)

## 2023-03-17 LAB — CBG MONITORING, ED: Glucose-Capillary: 80 mg/dL (ref 70–99)

## 2023-03-17 LAB — COMPREHENSIVE METABOLIC PANEL
ALT: 12 U/L (ref 0–44)
AST: 14 U/L — ABNORMAL LOW (ref 15–41)
Albumin: 4.3 g/dL (ref 3.5–5.0)
Alkaline Phosphatase: 46 U/L (ref 38–126)
Anion gap: 8 (ref 5–15)
BUN: 12 mg/dL (ref 6–20)
CO2: 22 mmol/L (ref 22–32)
Calcium: 8.7 mg/dL — ABNORMAL LOW (ref 8.9–10.3)
Chloride: 103 mmol/L (ref 98–111)
Creatinine, Ser: 0.74 mg/dL (ref 0.44–1.00)
GFR, Estimated: >60 mL/min (ref >60)
Glucose, Bld: 91 mg/dL (ref 70–99)
Potassium: 3.8 mmol/L (ref 3.5–5.1)
Sodium: 133 mmol/L — ABNORMAL LOW (ref 135–145)
Total Bilirubin: 0.5 mg/dL (ref 0.3–1.2)
Total Protein: 8.4 g/dL — ABNORMAL HIGH (ref 6.5–8.1)

## 2023-03-17 MED ORDER — MORPHINE SULFATE (PF) 4 MG/ML IV SOLN
4.0000 mg | Freq: Once | INTRAVENOUS | Status: AC
  Administered 2023-03-17: 4 mg via INTRAVENOUS
  Filled 2023-03-17: qty 1

## 2023-03-17 MED ORDER — ONDANSETRON HCL 4 MG/2ML IJ SOLN
4.0000 mg | Freq: Once | INTRAMUSCULAR | Status: AC
  Administered 2023-03-17: 4 mg via INTRAVENOUS
  Filled 2023-03-17: qty 2

## 2023-03-17 MED ORDER — ALBUTEROL SULFATE HFA 108 (90 BASE) MCG/ACT IN AERS
1.0000 | INHALATION_SPRAY | Freq: Once | RESPIRATORY_TRACT | Status: AC
  Administered 2023-03-17: 1 via RESPIRATORY_TRACT
  Filled 2023-03-17: qty 6.7

## 2023-03-17 NOTE — ED Triage Notes (Signed)
Per EMS, pt from hotel, c/o LUQ and LLQ pain, pelvic pain and heavy vaginal bleeding.  EMS gave a fresh pad for staff to assess amount of blood.  Pt has been bleeding all day and pain started two hours ago, took 400mg  naproxen w/ no relief, had 1 episode of emesis and continued nausea.    Stabbing pain different than period cramping.  150/100 80hr RR between 18-24

## 2023-03-17 NOTE — ED Notes (Signed)
Pt to US.

## 2023-03-17 NOTE — ED Provider Notes (Signed)
[place] EMERGENCY DEPARTMENT AT [facility] Provider Note   CSN: [id number] Arrival date & time: 03/17/23  2105     History Chief Complaint  Patient presents with   Vaginal Bleeding   Abdominal Pain   Pelvic Pain    Pamela Wong is a 27 y.o. female.  Patient presents to the emergency department complaints of vaginal bleeding, abdominal pain and pelvic pain.  Reports that symptoms began earlier this afternoon without any specific onset or triggering episode.  Reports that the pain is primarily in the left lower quadrant with radiation towards her left upper quadrant.  Endorsing significant vaginal bleeding although patient is currently on her menstrual cycle.  Not currently on any form of birth control.  Does report that she recently had a change in sexual partners and is unsure about STI status but has low concern for STIs as she is not currently endorsing any vaginal pain, discharge, or itching.  Denies any significant urinary symptoms such as dysuria, hematuria, increased urinary frequency or urgency.   Vaginal Bleeding Associated symptoms: abdominal pain   Abdominal Pain Associated symptoms: vaginal bleeding   Pelvic Pain Associated symptoms include abdominal pain.       Home Medications Prior to Admission medications   Medication Sig Start Date End Date Taking? Authorizing Provider  albuterol (VENTOLIN HFA) 108 (90 Base) MCG/ACT inhaler Inhale 2 puffs into the lungs every 4 (four) hours as needed for wheezing or shortness of breath. Patient not taking: Reported on 02/27/2023 01/24/23   Zenia Resides, MD  ibuprofen (ADVIL) 600 MG tablet Take 1 tablet (600 mg total) by mouth every 8 (eight) hours as needed (pain). Patient not taking: Reported on 02/27/2023 01/24/23   Zenia Resides, MD  metroNIDAZOLE (FLAGYL) 500 MG tablet Take 1 tablet (500 mg total) by mouth 2 (two) times daily. 03/02/23   Grayce Sessions, NP      Allergies    Patient has no  known allergies.    Review of Systems   Review of Systems  Gastrointestinal:  Positive for abdominal pain.  Genitourinary:  Positive for pelvic pain and vaginal bleeding.  All other systems reviewed and are negative.   Physical Exam Updated Vital Signs BP (!) 129/97   Pulse 60   Temp 98.5 F (36.9 C) (Oral)   Ht 5' 6.5" (1.689 m)   Wt 90.7 kg   SpO2 100%   BMI 31.80 kg/m  Physical Exam Vitals and nursing note reviewed. Exam conducted with a chaperone present.  Constitutional:      General: She is not in acute distress.    Appearance: She is well-developed.  HENT:     Head: Normocephalic and atraumatic.  Eyes:     Conjunctiva/sclera: Conjunctivae normal.  Cardiovascular:     Rate and Rhythm: Normal rate and regular rhythm.     Heart sounds: No murmur heard. Pulmonary:     Effort: Pulmonary effort is normal. No respiratory distress.     Breath sounds: Normal breath sounds.  Abdominal:     Palpations: Abdomen is soft.     Tenderness: There is abdominal tenderness in the left lower quadrant. There is rebound. There is no right CVA tenderness or left CVA tenderness.  Genitourinary:    General: Normal vulva.     Vagina: Normal. No vaginal discharge or erythema.     Cervix: Dilated. Cervical bleeding present. No cervical motion tenderness, discharge or friability.     Adnexa: Right adnexa normal.  Right: No mass or tenderness.         Left: Tenderness present. No mass.       Comments: Left adnexal tenderness with right unremarkable. No obvious CMT.  Slow rate of bleeding from cervix without obvious dilation present.  No appreciable discharge stemming from vaginal vault. Small amount of bleeding pooling in speculum. Musculoskeletal:        General: No swelling.     Cervical back: Neck supple.  Skin:    General: Skin is warm and dry.     Capillary Refill: Capillary refill takes less than 2 seconds.  Neurological:     Mental Status: She is alert.  Psychiatric:         Mood and Affect: Mood normal.     ED Results / Procedures / Treatments   Labs (all labs ordered are listed, but only abnormal results are displayed) Labs Reviewed  COMPREHENSIVE METABOLIC PANEL  CBC WITH DIFFERENTIAL/PLATELET  URINALYSIS, ROUTINE W REFLEX MICROSCOPIC  PREGNANCY, URINE  CBG MONITORING, ED    EKG None  Radiology No results found.  Procedures Procedures   Medications Ordered in ED Medications  ondansetron (ZOFRAN) injection 4 mg (has no administration in time range)  morphine (PF) 4 MG/ML injection 4 mg (has no administration in time range)    ED Course/ Medical Decision Making/ A&P                           Medical Decision Making Amount and/or Complexity of Data Reviewed Labs: ordered. Radiology: ordered.  Risk Prescription drug management.   This patient presents to the ED for concern of vaginal bleeding, abdominal pain.  Differential diagnosis includes tubo-ovarian abscess, diverticulitis, bowel obstruction, ectopic pregnancy   Lab Tests:  I Ordered, and personally interpreted labs.  The pertinent results include: CBC with mild leukocytosis at 11.5, CMP with mild hyponatremia at 133, UA with signs of infection with globin, leukocytes and bacteria present, negative urine pregnancy or hCG   Imaging Studies ordered:  I ordered imaging studies including pelvic ultrasound with torsion rule out, CT abdomen pelvis I independently visualized and interpreted imaging which showed left ovary suspicious for fallopian tube dilation.  CT abdomen pelvis imaging results pending at time of handoff I agree with the radiologist interpretation   Medicines ordered and prescription drug management:  I ordered medication including morphine, Zofran, albuterol for pain, nausea, shortness of breath Reevaluation of the patient after these medicines showed that the patient improved I have reviewed the patients home medicines and have made adjustments as  needed   Problem List / ED Course:  Patient presented to the emergency department complaints of abdominal pain, pelvic pain and vaginal bleeding.  Reports that she is experiencing left lower quadrant abdominal pain for several hours with increasing vaginal bleeding atypical for typical menstrual cycles.  Patient is currently on menstrual cycle.  She reports symptoms are about possible pregnancy as she is sexually active without contraceptive use.  Currently reporting that pain in the left lower quadrant is tender to 10.  On cursory examination, there is significant tenderness with rebound tenderness present in the left lower quadrant. CBC with mild leukocytosis at 11.5, CMP with mild hyponatremia at 133, CBG normal at 80, UA with signs of possible infection with blood, leukocytes, bacteria present.  Urine pregnancy and beta hCG negative.  Pelvic ultrasound with torsion rule out performed.  Negative for any acute findings of torsion but concern for dilated fallopian  tube with differential including hematosalpinx, pyosalpinx, tubo-ovarian abscess. Pelvic examination is largely unremarkable without any obvious drainage or discharge present only small amount of blood noted in the vaginal vault stemming from the cervix.  No cervical motion tenderness and only tenderness noted to the left adnexa.  Will order CT imaging for further evaluation of symptoms.  12:10 AM Care of Deshayla Helke transferred to [facility] and Dr. Eudelia Bunch at the end of my shift as the patient will require reassessment once labs/imaging have resulted. Patient presentation, ED course, and plan of care discussed with review of all pertinent labs and imaging. Please see his/her note for further details regarding further ED course and disposition. Plan at time of handoff is await results of CT abdomen pelvis given concern for potential fallopian tube abnormality. Unclear if admission will be needed at this time. This may be altered or  completely changed at the discretion of the oncoming team pending results of further workup.  Final Clinical Impression(s) / ED Diagnoses Final diagnoses:  None    Rx / DC Orders ED Discharge Orders     None         Smitty Knudsen, PA-C 03/18/23 0014    Charlynne Pander, MD 03/18/23 1451

## 2023-03-17 NOTE — ED Provider Notes (Signed)
  Physical Exam  BP (!) 129/97   Pulse 62   Temp 98.5 F (36.9 C) (Oral)   Resp 17   Ht 5' 6.5" (1.689 m)   Wt 90.7 kg   SpO2 99%   BMI 31.80 kg/m   Physical Exam  Procedures  Procedures  ED Course / MDM    Medical Decision Making Amount and/or Complexity of Data Reviewed Labs: ordered. Radiology: ordered.  Risk Prescription drug management.   Care of this patient assumed from preceding ED provider Eual Fines, PA-C at time of shift change.  Please see his associated note for further insight of the patient's ED course.  In brief patient is a 27 year old female with vaginal bleeding and left lower abdominal pain.  Left adnexal tenderness on the pelvic exam without CMT, concerning findings for edema of the left fallopian tube on ultrasound, CT recommended.     At time of shift change pending CT. CT concerning for dilated fallopian tube suggestive of salpingitis/pyosalpinx.  Will proceed with treatment for pelvic inflammatory disease.  Swabs initially obtained during pelvic exam, patient to self swab.,  Empiric antibiotic treatment for PID initiated in the ED.  Patient overall with normal hemodynamics, well-appearing.  Will discharge with oral treatment for PID and close outpatient OB/GYN follow-up.  Clinical concern for emergent finding that would warrant inpatient management or further ED workup is low.  Pamela Wong voiced understanding of her medical evaluation and treatment plan. Each of their questions answered to their expressed satisfaction.  Return precautions were given.  Patient is well-appearing, stable, and was discharged in good condition.  This chart was dictated using voice recognition software, Dragon. Despite the best efforts of this provider to proofread and correct errors, errors may still occur which can change documentation meaning.    Pamela Lore, PA-C 03/18/23 0232    Nira Conn, MD 03/18/23 [phone]

## 2023-03-18 ENCOUNTER — Emergency Department (HOSPITAL_COMMUNITY): Payer: BLUE CROSS/BLUE SHIELD

## 2023-03-18 ENCOUNTER — Encounter (HOSPITAL_COMMUNITY): Payer: Self-pay

## 2023-03-18 LAB — WET PREP, GENITAL
Clue Cells Wet Prep HPF POC: NONE SEEN
Sperm: NONE SEEN
Trich, Wet Prep: NONE SEEN
WBC, Wet Prep HPF POC: <10 (ref <10)
Yeast Wet Prep HPF POC: NONE SEEN

## 2023-03-18 LAB — PREGNANCY, URINE: Preg Test, Ur: NEGATIVE

## 2023-03-18 LAB — HCG, QUANTITATIVE, PREGNANCY: hCG, Beta Chain, Quant, S: <1 m[IU]/mL (ref <5)

## 2023-03-18 MED ORDER — DOXYCYCLINE HYCLATE 100 MG PO TABS
100.0000 mg | ORAL_TABLET | Freq: Once | ORAL | Status: AC
  Administered 2023-03-18: 100 mg via ORAL
  Filled 2023-03-18: qty 1

## 2023-03-18 MED ORDER — METRONIDAZOLE 500 MG PO TABS: 500.0000 mg | ORAL_TABLET | Freq: Two times a day (BID) | ORAL | 0 refills | Status: AC

## 2023-03-18 MED ORDER — IOHEXOL 300 MG/ML  SOLN
100.0000 mL | Freq: Once | INTRAMUSCULAR | Status: AC | PRN
  Administered 2023-03-18: 100 mL via INTRAVENOUS

## 2023-03-18 MED ORDER — SODIUM CHLORIDE 0.9 % IV SOLN
1.0000 g | Freq: Once | INTRAVENOUS | Status: AC
  Administered 2023-03-18: 1 g via INTRAVENOUS
  Filled 2023-03-18: qty 10

## 2023-03-18 MED ORDER — DOXYCYCLINE HYCLATE 100 MG PO CAPS: 100.0000 mg | ORAL_CAPSULE | Freq: Two times a day (BID) | ORAL | 0 refills | Status: AC

## 2023-03-18 MED ORDER — METRONIDAZOLE 500 MG PO TABS
500.0000 mg | ORAL_TABLET | Freq: Once | ORAL | Status: AC
  Administered 2023-03-18: 500 mg via ORAL
  Filled 2023-03-18: qty 1

## 2023-03-18 NOTE — ED Notes (Signed)
PT to CT.

## 2023-03-18 NOTE — Discharge Instructions (Addendum)
Please follow-up with the OB/GYN listed below.  Complete the antibiotics for the entire course.  Do not have any sexual intercourse for at least 2 weeks until you have completed your antibiotics.  Return there for open worsening abdominal pain, fevers, nausea vomiting, or any other severe symptom.

## 2023-03-19 LAB — GC/CHLAMYDIA PROBE AMP (~~LOC~~) NOT AT ARMC
Chlamydia: NEGATIVE
Comment: NEGATIVE
Comment: NORMAL
Neisseria Gonorrhea: NEGATIVE

## 2023-04-11 ENCOUNTER — Ambulatory Visit (INDEPENDENT_AMBULATORY_CARE_PROVIDER_SITE_OTHER): Payer: BLUE CROSS/BLUE SHIELD | Admitting: Primary Care

## 2023-07-04 ENCOUNTER — Emergency Department (HOSPITAL_COMMUNITY)
Admission: EM | Admit: 2023-07-04 | Discharge: 2023-07-04 | Disposition: A | Discharge status: Home / Self Care | Payer: BLUE CROSS/BLUE SHIELD | Attending: Emergency Medicine | Admitting: Emergency Medicine

## 2023-07-04 ENCOUNTER — Emergency Department (HOSPITAL_COMMUNITY)
Admission: EM | Admit: 2023-07-04 | Discharge: 2023-07-04 | Discharge status: Against Medical Advice | Payer: BLUE CROSS/BLUE SHIELD

## 2023-07-04 ENCOUNTER — Other Ambulatory Visit: Payer: Self-pay

## 2023-07-04 ENCOUNTER — Encounter (HOSPITAL_COMMUNITY): Payer: Self-pay

## 2023-07-04 DIAGNOSIS — L732 Hidradenitis suppurativa: Secondary | ICD-10-CM | POA: Diagnosis not present

## 2023-07-04 DIAGNOSIS — M7989 Other specified soft tissue disorders: Secondary | ICD-10-CM | POA: Diagnosis present

## 2023-07-04 DIAGNOSIS — J45909 Unspecified asthma, uncomplicated: Secondary | ICD-10-CM | POA: Insufficient documentation

## 2023-07-04 MED ORDER — DOXYCYCLINE HYCLATE 100 MG PO CAPS: 100.0000 mg | ORAL_CAPSULE | Freq: Two times a day (BID) | ORAL | 0 refills | Status: DC

## 2023-07-04 MED ORDER — DOXYCYCLINE HYCLATE 100 MG PO TABS
100.0000 mg | ORAL_TABLET | Freq: Once | ORAL | Status: AC
  Administered 2023-07-04: 100 mg via ORAL
  Filled 2023-07-04: qty 1

## 2023-07-04 MED ORDER — CHLORHEXIDINE GLUCONATE 4 % EX SOLN: Freq: Two times a day (BID) | CUTANEOUS | 0 refills | Status: DC

## 2023-07-04 NOTE — ED Provider Notes (Signed)
[place] [facility] EMERGENCY DEPARTMENT AT [facility] Provider Note   CSN: [id number] Arrival date & time: 07/04/23  0025     History  Chief Complaint  Patient presents with   Abscess    Pamela Wong is a 27 y.o. female.  Patient with past medical history significant for hidradenitis and asthma presents to the emergency room complaining of recurrent fluctuant area under right axilla.  Patient states she had this drained in the past.  She states that she has not been able to find a hidradenitis clinic for follow-up.  She states this area has developed over the past few days.  She denies any fever, nausea, vomiting, or systemic symptoms.   Abscess      Home Medications Prior to Admission medications   Medication Sig Start Date End Date Taking? Authorizing Provider  chlorhexidine (HIBICLENS) 4 % external liquid Apply topically 2 (two) times daily. Apply to affected area twice daily 07/04/23  Yes Darrick Grinder, PA-C  doxycycline (VIBRAMYCIN) 100 MG capsule Take 1 capsule (100 mg total) by mouth 2 (two) times daily. 07/04/23  Yes Darrick Grinder, PA-C  acetaminophen (TYLENOL) 500 MG tablet Take 500 mg by mouth every 6 (six) hours as needed for moderate pain.    [provider]  albuterol (VENTOLIN HFA) 108 (90 Base) MCG/ACT inhaler Inhale 2 puffs into the lungs every 4 (four) hours as needed for wheezing or shortness of breath. Patient not taking: Reported on 02/27/2023 01/24/23   Zenia Resides, MD  ibuprofen (ADVIL) 600 MG tablet Take 1 tablet (600 mg total) by mouth every 8 (eight) hours as needed (pain). Patient not taking: Reported on 02/27/2023 01/24/23   Zenia Resides, MD      Allergies    Patient has no known allergies.    Review of Systems   Review of Systems  Physical Exam Updated Vital Signs BP 138/81   Pulse 99   Temp 98.9 F (37.2 C) (Oral)   Resp 16   Ht 5\' 6"  (1.676 m)   Wt 86.2 kg   SpO2 99%   BMI 30.67 kg/m  Physical  Exam Vitals and nursing note reviewed.  Constitutional:      General: She is not in acute distress. HENT:     Head: Normocephalic and atraumatic.  Eyes:     Pupils: Pupils are equal, round, and reactive to light.  Pulmonary:     Effort: Pulmonary effort is normal. No respiratory distress.  Musculoskeletal:        General: No signs of injury.     Cervical back: Normal range of motion.  Skin:    General: Skin is dry.     Comments: Fluctuant area under right axilla with induration.  No drainage, no significant surrounding erythema  Neurological:     Mental Status: She is alert.  Psychiatric:        Speech: Speech normal.        Behavior: Behavior normal.     ED Results / Procedures / Treatments   Labs (all labs ordered are listed, but only abnormal results are displayed) Labs Reviewed - No data to display  EKG None  Radiology No results found.  Procedures Procedures    Medications Ordered in ED Medications  doxycycline (VIBRA-TABS) tablet 100 mg (has no administration in time range)    ED Course/ Medical Decision Making/ A&P  Medical Decision Making  This patient presents to the ED for concern of possible abscess, this involves an extensive number of treatment options, and is a complaint that carries with it a high risk of complications and morbidity.  The differential diagnosis includes hidradenitis, abscess, cellulitis, others   Co morbidities that complicate the patient evaluation  Hidradenitis    Problem List / ED Course / Critical interventions / Medication management   I ordered medication including doxycycline for infection  Reevaluation of the patient after these medicines showed that the patient stayed the same I have reviewed the patients home medicines and have made adjustments as needed   Test / Admission - Considered:  Patient with reported history of hidradenitis.  No indication for drainage in the emergent  setting.  Plan to discharge home with prescription for Hibiclens to be used twice daily along with doxycycline.  Patient will be referred to hidradenitis clinic.  Return precautions provided.         Final Clinical Impression(s) / ED Diagnoses Final diagnoses:  Hidradenitis suppurativa of right axilla    Rx / DC Orders ED Discharge Orders          Ordered    doxycycline (VIBRAMYCIN) 100 MG capsule  2 times daily        07/04/23 0234    chlorhexidine (HIBICLENS) 4 % external liquid  2 times daily        07/04/23 0234              Pamala Duffel 07/04/23 0235    Palumbo, April, MD 07/04/23 0502

## 2023-07-04 NOTE — Discharge Instructions (Addendum)
Please take the prescribed antibiotics and use the wash twice daily over the affected area. Follow up with the hidradenitis clinic. Return to the emergency department if you develop any life threatening symptoms

## 2023-07-04 NOTE — ED Triage Notes (Signed)
Pt presents via POV c/o abscess under right arm. Reports hx of HS. Ambulatory to triage. Reports needing armpit drained.

## 2023-07-21 ENCOUNTER — Inpatient Hospital Stay (HOSPITAL_COMMUNITY)
Admission: AD | Admit: 2023-07-21 | Discharge: 2023-07-21 | Disposition: A | Discharge status: Home / Self Care | Payer: BLUE CROSS/BLUE SHIELD | Attending: Obstetrics & Gynecology | Admitting: Obstetrics & Gynecology

## 2023-07-21 ENCOUNTER — Encounter (HOSPITAL_COMMUNITY): Payer: Self-pay | Admitting: Obstetrics & Gynecology

## 2023-07-21 ENCOUNTER — Ambulatory Visit (HOSPITAL_COMMUNITY)
Admission: EM | Admit: 2023-07-21 | Discharge: 2023-07-21 | Disposition: A | Discharge status: Home / Self Care | Payer: BLUE CROSS/BLUE SHIELD | Attending: Internal Medicine | Admitting: Internal Medicine

## 2023-07-21 ENCOUNTER — Encounter (HOSPITAL_COMMUNITY): Payer: Self-pay | Admitting: Emergency Medicine

## 2023-07-21 ENCOUNTER — Inpatient Hospital Stay (HOSPITAL_COMMUNITY): Payer: BLUE CROSS/BLUE SHIELD

## 2023-07-21 DIAGNOSIS — O26891 Other specified pregnancy related conditions, first trimester: Secondary | ICD-10-CM | POA: Diagnosis present

## 2023-07-21 DIAGNOSIS — F17290 Nicotine dependence, other tobacco product, uncomplicated: Secondary | ICD-10-CM | POA: Diagnosis not present

## 2023-07-21 DIAGNOSIS — Z3491 Encounter for supervision of normal pregnancy, unspecified, first trimester: Secondary | ICD-10-CM

## 2023-07-21 DIAGNOSIS — O99511 Diseases of the respiratory system complicating pregnancy, first trimester: Secondary | ICD-10-CM | POA: Insufficient documentation

## 2023-07-21 DIAGNOSIS — O219 Vomiting of pregnancy, unspecified: Secondary | ICD-10-CM | POA: Diagnosis not present

## 2023-07-21 DIAGNOSIS — R1030 Lower abdominal pain, unspecified: Secondary | ICD-10-CM

## 2023-07-21 DIAGNOSIS — Z3A01 Less than 8 weeks gestation of pregnancy: Secondary | ICD-10-CM | POA: Diagnosis not present

## 2023-07-21 DIAGNOSIS — Z3201 Encounter for pregnancy test, result positive: Secondary | ICD-10-CM | POA: Diagnosis not present

## 2023-07-21 DIAGNOSIS — O99331 Smoking (tobacco) complicating pregnancy, first trimester: Secondary | ICD-10-CM | POA: Diagnosis not present

## 2023-07-21 DIAGNOSIS — F17210 Nicotine dependence, cigarettes, uncomplicated: Secondary | ICD-10-CM | POA: Diagnosis not present

## 2023-07-21 HISTORY — DX: Urinary tract infection, site not specified: N39.0

## 2023-07-21 HISTORY — DX: Trichomoniasis, unspecified: A59.9

## 2023-07-21 HISTORY — DX: Hidradenitis suppurativa: L73.2

## 2023-07-21 HISTORY — DX: Depression, unspecified: F32.A

## 2023-07-21 HISTORY — DX: Other fracture of unspecified lower leg, initial encounter for closed fracture: S82.899A

## 2023-07-21 LAB — CBC
HCT: 35.8 % — ABNORMAL LOW (ref 36.0–46.0)
Hemoglobin: 11.6 g/dL — ABNORMAL LOW (ref 12.0–15.0)
MCH: 29.5 pg (ref 26.0–34.0)
MCHC: 32.4 g/dL (ref 30.0–36.0)
MCV: 91.1 fL (ref 80.0–100.0)
Platelets: 206 10*3/uL (ref 150–400)
RBC: 3.93 MIL/uL (ref 3.87–5.11)
RDW: 15.9 % — ABNORMAL HIGH (ref 11.5–15.5)
WBC: 9.9 10*3/uL (ref 4.0–10.5)
nRBC: 0 % (ref 0.0–0.2)

## 2023-07-21 LAB — ABO/RH: ABO/RH(D): A POS

## 2023-07-21 LAB — POCT URINALYSIS DIP (MANUAL ENTRY)
Bilirubin, UA: NEGATIVE
Blood, UA: NEGATIVE
Glucose, UA: NEGATIVE mg/dL
Ketones, POC UA: NEGATIVE mg/dL
Nitrite, UA: NEGATIVE
Protein Ur, POC: NEGATIVE mg/dL
Spec Grav, UA: 1.015 (ref 1.010–1.025)
Urobilinogen, UA: 2 U/dL — AB
pH, UA: 7 (ref 5.0–8.0)

## 2023-07-21 LAB — WET PREP, GENITAL
Sperm: NONE SEEN
Trich, Wet Prep: NONE SEEN
WBC, Wet Prep HPF POC: <10 (ref <10)
Yeast Wet Prep HPF POC: NONE SEEN

## 2023-07-21 LAB — HCG, QUANTITATIVE, PREGNANCY: hCG, Beta Chain, Quant, S: 89464 m[IU]/mL — ABNORMAL HIGH (ref <5)

## 2023-07-21 LAB — POCT URINE PREGNANCY: Preg Test, Ur: POSITIVE — AB

## 2023-07-21 MED ORDER — ONDANSETRON 4 MG PO TBDP
8.0000 mg | ORAL_TABLET | Freq: Once | ORAL | Status: AC
  Administered 2023-07-21: 8 mg via ORAL
  Filled 2023-07-21: qty 2

## 2023-07-21 MED ORDER — PROMETHAZINE HCL 25 MG PO TABS: 25.0000 mg | ORAL_TABLET | Freq: Four times a day (QID) | ORAL | 2 refills | Status: DC | PRN

## 2023-07-21 MED ORDER — ONDANSETRON 8 MG PO TBDP: 8.0000 mg | ORAL_TABLET | Freq: Three times a day (TID) | ORAL | 2 refills | Status: DC | PRN

## 2023-07-21 MED ORDER — ALBUTEROL SULFATE HFA 108 (90 BASE) MCG/ACT IN AERS: 1.0000 | INHALATION_SPRAY | Freq: Four times a day (QID) | RESPIRATORY_TRACT | 2 refills | Status: DC | PRN

## 2023-07-21 NOTE — MAU Provider Note (Signed)
History    CSN: [id number]  Arrival date and time: 07/21/23 1640   Event Date/Time   First Provider Initiated Contact with Patient 07/21/23 1826      Chief Complaint  Patient presents with   Abdominal Pain   Nausea   HPI  Pamela Wong is a 27 y.o. G1P0000 at [redacted]w[redacted]d who presents for evaluation of lower abdominal cramping and nausea. Patient reports the bottom of her abdomen has been aching for several days. Patient rates the pain as a 3/10 and has not tried anything for the pain. She also feels like she isn't able to keep anything down. She had "oriental noodles" for breakfast and threw up but was able to keep down a granola bar from Urgent Care this afternoon. She does not have any antiemetics. She denies any vaginal bleeding and discharge. Denies any constipation, diarrhea or any urinary complaints.   OB History     Gravida  1   Para      Term      Preterm      AB  0   Living  0      SAB      IAB      Ectopic  0   Multiple      Live Births  0           Past Medical History:  Diagnosis Date   Asthma    Depression    Fracture, ankle    rt and left when young   Hidradenitis suppurativa    PID (pelvic inflammatory disease) 03/2023   Trichomonas infection    UTI (urinary tract infection)     Past Surgical History:  Procedure Laterality Date   NO PAST SURGERIES      Family History  Problem Relation Age of Onset   Hypertension Mother    Other Father        unknown hx    Social History   Tobacco Use   Smoking status: Every Day    Current packs/day: 0.00    Types: Cigarettes, Cigars    Start date: 01/2006    Last attempt to quit: 01/2016    Years since quitting: 7.5   Smokeless tobacco: Never   Tobacco comments:    Black and milds  Vaping Use   Vaping status: Never Used  Substance Use Topics   Alcohol use: Yes    Comment: "sometimes" - last was a couple wks ago   Drug use: Yes    Types: Cocaine, Marijuana    Comment: marijuana  yesterday, cocaine 3 days ago    Allergies: No Known Allergies  No medications prior to admission.    Review of Systems  Constitutional: Negative.  Negative for fatigue and fever.  HENT: Negative.    Respiratory: Negative.  Negative for shortness of breath.   Cardiovascular: Negative.  Negative for chest pain.  Gastrointestinal:  Positive for abdominal pain and nausea. Negative for constipation, diarrhea and vomiting.  Genitourinary: Negative.  Negative for dysuria, vaginal bleeding and vaginal discharge.  Neurological: Negative.  Negative for dizziness and headaches.   Physical Exam   Blood pressure 117/61, pulse 70, temperature 98.5 F (36.9 C), temperature source Oral, resp. rate 18, height 5\' 6"  (1.676 m), weight 92.4 kg, last menstrual period 06/03/2023, SpO2 100%.  Patient Vitals for the past 24 hrs:  BP Temp Temp src Pulse Resp SpO2 Height Weight  07/21/23 1711 117/61 98.5 F (36.9 C) Oral 70 18 100 % 5\' 6"  (  1.676 m) 92.4 kg    Physical Exam Vitals and nursing note reviewed.  Constitutional:      General: She is not in acute distress.    Appearance: She is well-developed.  HENT:     Head: Normocephalic.  Eyes:     Pupils: Pupils are equal, round, and reactive to light.  Cardiovascular:     Rate and Rhythm: Normal rate and regular rhythm.     Heart sounds: Normal heart sounds.  Pulmonary:     Effort: Pulmonary effort is normal. No respiratory distress.     Breath sounds: Normal breath sounds.  Abdominal:     General: Bowel sounds are normal. There is no distension.     Palpations: Abdomen is soft.     Tenderness: There is no abdominal tenderness.  Skin:    General: Skin is warm and dry.  Neurological:     Mental Status: She is alert and oriented to person, place, and time.  Psychiatric:        Mood and Affect: Mood normal.        Behavior: Behavior normal.        Thought Content: Thought content normal.        Judgment: Judgment normal.       MAU  Course  Procedures  Results for orders placed or performed during the hospital encounter of 07/21/23 (from the past 24 hour(s))  ABO/Rh     Status: None   Collection Time: 07/21/23  4:52 PM  Result Value Ref Range   ABO/RH(D) A POS    No rh immune globuloin      NOT A RH IMMUNE GLOBULIN CANDIDATE, PT RH POSITIVE Performed at [facility], [address] [place], [place] [address]   CBC     Status: Abnormal   Collection Time: 07/21/23  4:56 PM  Result Value Ref Range   WBC 9.9 4.0 - 10.5 K/uL   RBC 3.93 3.87 - 5.11 MIL/uL   Hemoglobin 11.6 (L) 12.0 - 15.0 g/dL   HCT 40.1 (L) 02.7 - 25.3 %   MCV 91.1 80.0 - 100.0 fL   MCH 29.5 26.0 - 34.0 pg   MCHC 32.4 30.0 - 36.0 g/dL   RDW 66.4 (H) 40.3 - 47.4 %   Platelets 206 150 - 400 K/uL   nRBC 0.0 0.0 - 0.2 %  hCG, quantitative, pregnancy     Status: Abnormal   Collection Time: 07/21/23  4:56 PM  Result Value Ref Range   hCG, Beta Chain, Quant, S 89,464 (H) <5 mIU/mL  Wet prep, genital     Status: Abnormal   Collection Time: 07/21/23  5:35 PM  Result Value Ref Range   Yeast Wet Prep HPF POC NONE SEEN NONE SEEN   Trich, Wet Prep NONE SEEN NONE SEEN   Clue Cells Wet Prep HPF POC PRESENT (A) NONE SEEN   WBC, Wet Prep HPF POC <10 <10   Sperm NONE SEEN      US OB LESS THAN 14 WEEKS WITH OB TRANSVAGINAL  Result Date: 07/21/2023 CLINICAL DATA:  Initial evaluation for acute abdominal pain, early pregnancy. EXAM: OBSTETRIC <14 WK [place] AND TRANSVAGINAL OB US TECHNIQUE: Both transabdominal and transvaginal ultrasound examinations were performed for complete evaluation of the gestation as well as the maternal uterus, adnexal regions, and pelvic cul-de-sac. Transvaginal technique was performed to assess early pregnancy. COMPARISON:  None Available. FINDINGS: Intrauterine gestational sac: Single Yolk sac:  Present Embryo:  Present Cardiac Activity: Present  Heart Rate: 160 bpm CRL:  12.1 mm   7 w   3 d                  [place] EDC: 03/05/2024  Subchorionic hemorrhage:  None visualized. Maternal uterus/adnexae: Ovaries within normal limits. No adnexal mass or free fluid. IMPRESSION: 1. Single viable IUP, estimated gestational age [redacted] weeks and 3 days by crown-rump length, with ultrasound EDC of 03/05/2024. No complication. 2. No other acute maternal uterine or adnexal abnormality. Electronically Signed   By: Rise Mu M.D.   On: 07/21/2023 18:20     MDM Labs ordered and reviewed.   UA, UPT CBC, HCG ABO/Rh- A Pos Wet prep and gc/chlamydia US OB Comp Less 14 weeks with Transvaginal  CNM independently reviewed the imaging ordered. Imaging show live IUP consistent with LMP  Zofran ODT- patient reports improvement of nausea  Assessment and Plan   1. Normal intrauterine pregnancy on prenatal ultrasound in first trimester   2. [redacted] weeks gestation of pregnancy   3. Nausea and vomiting during pregnancy     -Discharge home in stable condition -Rx for zofran and phenergan sent to pharmacy. Patient requested refill of albuterol to be sent in -First trimester precautions discussed -Patient advised to follow-up with OB to establish prenatal care -Patient may return to MAU as needed or if her condition were to change or worsen  Rolm Bookbinder, CNM 07/21/2023, 6:30 PM

## 2023-07-21 NOTE — ED Provider Notes (Signed)
[facility]    CSN: [id number] Arrival date & time: 07/21/23  1510     History   Chief Complaint Chief Complaint  Patient presents with   Nausea    HPI Pamela Wong is a 27 y.o. female.  2 day history of nausea and abdominal pain Emesis after eating this morning No diarrhea. Denies fever  LMP is unknown. Maybe 2 months ago She thinks she had a miscarriage last month but not sure. Was not evaluated   Past Medical History:  Diagnosis Date   Asthma     There are no problems to display for this patient.   History reviewed. No pertinent surgical history.  OB History     Gravida  1   Para      Term      Preterm      AB      Living         SAB      IAB      Ectopic      Multiple      Live Births               Home Medications    Prior to Admission medications   Not on File    Family History Family History  Problem Relation Age of Onset   Healthy Mother     Social History Social History   Tobacco Use   Smoking status: Former    Current packs/day: 0.00    Types: Cigarettes    Start date: 01/2006    Quit date: 01/2016    Years since quitting: 7.5   Smokeless tobacco: Never  Substance Use Topics   Alcohol use: Yes    Comment: "sometimes"   Drug use: Yes    Types: Cocaine, Marijuana    Comment: "sometimes     Allergies   Patient has no known allergies.   Review of Systems Review of Systems As per HPI  Physical Exam Triage Vital Signs ED Triage Vitals  Encounter Vitals Group     BP 07/21/23 1607 124/76     Systolic BP Percentile --      Diastolic BP Percentile --      Pulse Rate 07/21/23 1607 82     Resp 07/21/23 1607 18     Temp 07/21/23 1607 98.2 F (36.8 C)     Temp Source 07/21/23 1607 Oral     SpO2 07/21/23 1607 98 %     Weight --      Height --      Head Circumference --      Peak Flow --      Pain Score 07/21/23 1609 8     Pain Loc --      Pain Education --      Exclude from  Growth Chart --    No data found.  Updated Vital Signs BP 124/76 (BP Location: Right Arm)   Pulse 82   Temp 98.2 F (36.8 C) (Oral)   Resp 18   LMP  (LMP Unknown)   SpO2 98%   Visual Acuity Right Eye Distance:   Left Eye Distance:   Bilateral Distance:    Right Eye Near:   Left Eye Near:    Bilateral Near:     Physical Exam Vitals and nursing note reviewed.  Constitutional:      Appearance: Normal appearance.  Cardiovascular:     Rate and Rhythm: Normal rate and regular rhythm.  Heart sounds: Normal heart sounds.  Pulmonary:     Effort: Pulmonary effort is normal.     Breath sounds: Normal breath sounds.  Abdominal:     Tenderness: There is abdominal tenderness in the periumbilical area and suprapubic area. There is no rebound.  Musculoskeletal:        General: Normal range of motion.  Skin:    General: Skin is warm and dry.  Neurological:     Mental Status: She is alert and oriented to person, place, and time.      UC Treatments / Results  Labs (all labs ordered are listed, but only abnormal results are displayed) Labs Reviewed  POCT URINE PREGNANCY - Abnormal; Notable for the following components:      Result Value   Preg Test, Ur Positive (*)    All other components within normal limits  POCT URINALYSIS DIP (MANUAL ENTRY) - Abnormal; Notable for the following components:   Clarity, UA cloudy (*)    Urobilinogen, UA 2.0 (*)    Leukocytes, UA Small (1+) (*)    All other components within normal limits  URINE CULTURE    EKG  Radiology No results found.  Procedures Procedures (including critical care time)  Medications Ordered in UC Medications - No data to display  Initial Impression / Assessment and Plan / UC Course  I have reviewed the triage vital signs and the nursing notes.  Pertinent labs & imaging results that were available during my care of the patient were reviewed by me and considered in my medical decision making (see chart for  details).  UPT is positive  UA small leuks Patient is tender suprapubic and periumbilical She is very uncomfortable with abdominal exam Sent to MAU for ultrasound   Final Clinical Impressions(s) / UC Diagnoses   Final diagnoses:  Positive pregnancy test  Lower abdominal pain   Discharge Instructions   None    ED Prescriptions   None    PDMP not reviewed this encounter.   Marlow Baars, [place] 07/21/23 1610

## 2023-07-21 NOTE — ED Notes (Signed)
Patient is being discharged from the Urgent Care and sent to the Emergency Department via POV . Per [employer], PA, patient is in need of higher level of care due to Abdominal pain and pregnancy. Patient is aware and verbalizes understanding of plan of care.  Vitals:   07/21/23 1607  BP: 124/76  Pulse: 82  Resp: 18  Temp: 98.2 F (36.8 C)  SpO2: 98%

## 2023-07-21 NOTE — MAU Note (Signed)
Pamela Wong is a 27 y.o. at [redacted]w[redacted]d, here in MAU reporting: feeling sick, like nausea and pain in her stomach. Nausea started 2-3 days ago. Through up once this morning.  "Can keep water down, can't keep food down".  Had a grain bar at UC, was able to keep it down..... this morning had oriental noodles.  Pain in upper abd was after she threw up. LMP: 9/29 according to app Onset of complaint: nausea x2days.  Pain score: moderate Vitals:   07/21/23 1711  BP: 117/61  Pulse: 70  Resp: 18  Temp: 98.5 F (36.9 C)  SpO2: 100%      Lab orders placed from triage:  blood work has been drawn, swabs collected.

## 2023-07-21 NOTE — ED Triage Notes (Signed)
Pt c/o being nauseous for 2 days. States she threw up after eating this morning.

## 2023-07-21 NOTE — Discharge Instructions (Signed)

## 2023-07-23 LAB — GC/CHLAMYDIA PROBE AMP (~~LOC~~) NOT AT ARMC
Chlamydia: NEGATIVE
Comment: NEGATIVE
Comment: NORMAL
Neisseria Gonorrhea: NEGATIVE

## 2023-07-30 ENCOUNTER — Inpatient Hospital Stay (HOSPITAL_COMMUNITY)
Admission: AD | Admit: 2023-07-30 | Discharge: 2023-07-30 | Disposition: A | Discharge status: Home / Self Care | Payer: BLUE CROSS/BLUE SHIELD | Attending: Obstetrics & Gynecology | Admitting: Obstetrics & Gynecology

## 2023-07-30 ENCOUNTER — Encounter (HOSPITAL_COMMUNITY): Payer: Self-pay | Admitting: Obstetrics & Gynecology

## 2023-07-30 DIAGNOSIS — O26891 Other specified pregnancy related conditions, first trimester: Secondary | ICD-10-CM

## 2023-07-30 DIAGNOSIS — O26851 Spotting complicating pregnancy, first trimester: Secondary | ICD-10-CM | POA: Diagnosis present

## 2023-07-30 DIAGNOSIS — N760 Acute vaginitis: Secondary | ICD-10-CM | POA: Diagnosis not present

## 2023-07-30 DIAGNOSIS — Z3A08 8 weeks gestation of pregnancy: Secondary | ICD-10-CM | POA: Insufficient documentation

## 2023-07-30 DIAGNOSIS — B9689 Other specified bacterial agents as the cause of diseases classified elsewhere: Secondary | ICD-10-CM | POA: Insufficient documentation

## 2023-07-30 DIAGNOSIS — O23591 Infection of other part of genital tract in pregnancy, first trimester: Secondary | ICD-10-CM | POA: Diagnosis not present

## 2023-07-30 LAB — URINALYSIS, ROUTINE W REFLEX MICROSCOPIC
Bacteria, UA: NONE SEEN
Bilirubin Urine: NEGATIVE
Glucose, UA: NEGATIVE mg/dL
Ketones, ur: NEGATIVE mg/dL
Leukocytes,Ua: NEGATIVE
Nitrite: NEGATIVE
Protein, ur: NEGATIVE mg/dL
Specific Gravity, Urine: 1.006 (ref 1.005–1.030)
pH: 7 (ref 5.0–8.0)

## 2023-07-30 LAB — WET PREP, GENITAL
Sperm: NONE SEEN
Trich, Wet Prep: NONE SEEN
WBC, Wet Prep HPF POC: <10 (ref <10)
Yeast Wet Prep HPF POC: NONE SEEN

## 2023-07-30 MED ORDER — PROMETHAZINE HCL 25 MG PO TABS: 25.0000 mg | ORAL_TABLET | Freq: Four times a day (QID) | ORAL | 3 refills | Status: DC | PRN

## 2023-07-30 MED ORDER — METRONIDAZOLE 500 MG PO TABS
500.0000 mg | ORAL_TABLET | Freq: Two times a day (BID) | ORAL | 0 refills | Status: AC
Start: 2023-07-30 — End: 2023-08-06

## 2023-07-30 MED ORDER — ONDANSETRON 8 MG PO TBDP: 8.0000 mg | ORAL_TABLET | Freq: Three times a day (TID) | ORAL | 3 refills | Status: DC | PRN

## 2023-07-30 NOTE — Discharge Instructions (Signed)
[place] for [employer] at [facility] [facility]             [address] [place], [place] [address] [place] for [employer] at [place], [address], [place], [place], [address] [phone]  Center for [facility] at [place] [address], [place], [place], [place], [address] [phone]  Center for [facility] at [facility] [address] [place], [place], [place], [address] [phone]  Center for [place] at [facility]                                 [place], [place], [place], [address] [phone]  Center for [place] at [facility]                                    [address] [place], [place], [address] [place] for [place] at [facility] [address] [place], [place], [place], [address]                              [facility] [place], [place], [place], [place], [address] [phone]  [facility]         Phone: [phone]  [facility]      Phone: [facility]      Phone: [place] [facility]         Phone: [phone]   [employer] Department-M[facility]    Phone: [place]      Phone: [phone]  [facility]     Phone: [phone]  [facility]        Phone: [phone]  [facility] ([place]) [phone]  [facility]      Phone: [phone]                        Safe Medications in Pregnancy    Acne: Benzoyl Peroxide Salicylic Acid  Backache/Headache: Tylenol: 2 regular strength every 4 hours OR               2 Extra strength every 6 hours  Colds/Coughs/Allergies: Benadryl (alcohol free) 25 mg every 6 hours as needed Breath right strips Claritin Cepacol throat lozenges Chloraseptic throat spray Cold-Eeze- up to three times per day Cough drops, alcohol free Flonase (by prescription only) Guaifenesin Mucinex Robitussin DM (plain only, alcohol free) Saline nasal spray/drops Sudafed (pseudoephedrine) & Actifed ** use only after [redacted] weeks gestation and if you do not have high blood pressure Tylenol Vicks Vaporub  Zinc lozenges Zyrtec   Constipation: Colace Ducolax suppositories Fleet enema Glycerin suppositories Metamucil Milk of magnesia Miralax Senokot Smooth move tea  Diarrhea: Kaopectate Imodium A-D  *NO pepto Bismol  Hemorrhoids: Anusol Anusol HC Preparation H Tucks  Indigestion: Tums Maalox Mylanta Zantac  Pepcid  Insomnia: Benadryl (alcohol free) '25mg'$  every 6 hours as needed Tylenol PM Unisom, no Gelcaps  Leg Cramps: Tums MagGel  Nausea/Vomiting:  Bonine Dramamine Emetrol Ginger extract Sea bands Meclizine  Nausea medication to take during pregnancy:  Unisom (doxylamine succinate 25 mg tablets) Take one tablet daily at bedtime. If symptoms are not adequately controlled, the dose can be increased to a maximum recommended dose of two tablets daily (1/2 tablet in the morning, 1/2 tablet mid-afternoon and one at bedtime). Vitamin B6 '100mg'$  tablets. Take one tablet twice a day (up to 200 mg per day).  Skin Rashes: Aveeno products Benadryl cream or '25mg'$  every 6 hours as needed Calamine Lotion 1% cortisone cream  Yeast infection: Gyne-lotrimin 7 Monistat 7   **If taking multiple medications, please check labels to avoid duplicating the same active ingredients **take medication as directed on the label ** Do not exceed 4000 mg of tylenol in 24 hours **Do not take medications that contain aspirin or ibuprofen

## 2023-07-30 NOTE — MAU Provider Note (Signed)
Pink Vaginal Spotting    [name] is a 27 y.o. G1P0000 pregnant female at [redacted]w[redacted]d who presents to MAU today with complaint of pink spotting when using the restroom this morning. Denies any pain, just spotting.  Denies recent coitus or other objects within the vagina.  IUP confirmed 07/21/23.  Tested positive for Bacterial Vaginitis, pt states no script for BV from 11/16.      Has not established care yet.   Pertinent items noted in HPI and remainder of comprehensive ROS otherwise negative.   O BP 112/66 (BP Location: Right Arm)   Pulse 80   Temp 98.3 F (36.8 C) (Oral)   Resp 17   Ht 5\' 6"  (1.676 m)   Wt 95.3 kg   LMP 06/03/2023   SpO2 100%   BMI 33.89 kg/m  Physical Exam Vitals and nursing note reviewed.  Constitutional:      General: She is not in acute distress.    Appearance: Normal appearance. She is normal weight. She is not ill-appearing.  HENT:     Head: Normocephalic and atraumatic.     Right Ear: External ear normal.     Left Ear: External ear normal.     Nose: Nose normal.     Mouth/Throat:     Mouth: Mucous membranes are moist.     Pharynx: Oropharynx is clear.  Eyes:     Extraocular Movements: Extraocular movements intact.     Conjunctiva/sclera: Conjunctivae normal.  Cardiovascular:     Rate and Rhythm: Normal rate.  Pulmonary:     Effort: Pulmonary effort is normal. No respiratory distress.  Abdominal:     General: Abdomen is flat. There is no distension.     Palpations: Abdomen is soft.     Tenderness: There is no abdominal tenderness.  Musculoskeletal:        General: No swelling. Normal range of motion.     Cervical back: Normal range of motion.  Skin:    General: Skin is warm and dry.  Neurological:     Mental Status: She is alert and oriented to person, place, and time. Mental status is at baseline.  Psychiatric:        Behavior: Behavior normal.        Thought Content: Thought content normal.        Judgment: Judgment normal.      Comments: anxious    Confirmed IUP with cardiac activity with bedside US, patient and patient's sister witnessed as well.    MDM: MAU Course:  Wet Prep recollected today but will send script for presumptive BV GC collected UA collected  A&P: #[redacted] weeks gestation #Pink vaginal spotting  #BV Sending metronidazole script F/u urine studies and swabs Given preterm/miscarriage precautions  Provided with list of OBGYN providers and OTC meds   Discharge from MAU in stable condition with strict/usual precautions Follow up at desired OBGYN as scheduled for ongoing prenatal care  Allergies as of 07/30/2023   No Known Allergies      Medication List     TAKE these medications    albuterol 108 (90 Base) MCG/ACT inhaler Commonly known as: VENTOLIN HFA Inhale 1-2 puffs into the lungs every 6 (six) hours as needed for wheezing or shortness of breath.   metroNIDAZOLE 500 MG tablet Commonly known as: Flagyl Take 1 tablet (500 mg total) by mouth 2 (two) times daily for 7 days.   ondansetron 8 MG disintegrating tablet Commonly known as: ZOFRAN-ODT Take 1 tablet (  8 mg total) by mouth every 8 (eight) hours as needed.   PRENATAL ADULT GUMMY/DHA/FA PO Take 1 tablet by mouth daily.   promethazine 25 MG tablet Commonly known as: PHENERGAN Take 1 tablet (25 mg total) by mouth every 6 (six) hours as needed for nausea or vomiting.        Pamela Dibble, MD 07/30/2023 9:14 AM

## 2023-07-30 NOTE — MAU Note (Signed)
Pamela Wong is a 27 y.o. at [redacted]w[redacted]d here in MAU reporting: saw pink spotting when she used the restroom this morning. No hurting, just the spotting.  Wanted to check on the baby.  Denies any recent intercourse.  Onset of complaint: this morning Pain score: none VS: 112/66 P 80 R 17 T 98.3   o2Sat 100%  Asked about getting refill for nausea meds, they are helping

## 2023-07-31 LAB — GC/CHLAMYDIA PROBE AMP (~~LOC~~) NOT AT ARMC
Chlamydia: NEGATIVE
Comment: NEGATIVE
Comment: NORMAL
Neisseria Gonorrhea: NEGATIVE

## 2023-08-10 NOTE — Progress Notes (Unsigned)
New OB Intake  I connected with Pamela Wong  on 08/10/23 at  1:15 PM EST by {Contact:24193} Video Visit and verified that I am speaking with the correct person using two identifiers. Nurse is located at [facility] and pt is located at ***.  I discussed the limitations, risks, security and privacy concerns of performing an evaluation and management service by telephone and the availability of in person appointments. I also discussed with the patient that there may be a patient responsible charge related to this service. The patient expressed understanding and agreed to proceed.  I explained I am completing New OB Intake today. We discussed EDD of 03/09/2024, by Last Menstrual Period. Pt is G1P0000. I reviewed her allergies, medications and Medical/Surgical/OB history.    There are no problems to display for this patient.   Concerns addressed today  Delivery Plans Plans to deliver at [facility]. Discussed the nature of our practice with multiple providers including residents and students. Due to the size of the practice, the delivering provider may not be the same as those providing prenatal care.   Patient {Is/is not:9024} interested in water birth. Offered upcoming OB visit with CNM to discuss further.  MyChart/Babyscripts MyChart access verified. I explained pt will have some visits in office and some virtually. Babyscripts instructions given and order placed. Patient verifies receipt of registration text/e-mail. Account successfully created and app downloaded.  Blood Pressure Cuff/Weight Scale {blood pressure cuff:24241} Explained after first prenatal appt pt will check weekly and document in Babyscripts. Patient {weight scale:28336}.  Anatomy US Explained first scheduled [place] will be around 19 weeks. Anatomy US scheduled for *** at ***.  Is patient a CenteringPregnancy candidate?  {Accepted:19197::"Accepted","Not a Candidate","Declined"} Declined due to  {Declined:19197::"Schedule","Childcare","Group setting","Support person concern","Declined to say","Enrolled in [facility]","***"} Not a candidate due to {Not a Candidate:19197::"DM","CHTN, medication controlled","Language barrier",">28 weeks","Multiple gestation (mono-mono or mono-di)","Complex coordination of care needed","***"} If accepted,    Is patient a Mom+Baby Combined Care candidate?  {Accepted:19197::"Accepted","Declined","Not a candidate","***"}   If accepted, confirm patient does not intend to move from the area for at least 12 months, then notify Mom+Baby staff  Interested in [place]? If yes, send referral and doula dot phrase.   Is patient a candidate for Babyscripts Optimization? {babyscripts:31704}   First visit review I reviewed new OB appt with patient. Explained pt will be seen by *** at first visit. Discussed Avelina Laine genetic screening with patient. *** Panorama and Horizon.. Routine prenatal labs {collected today/needed at new OB visit:9024}   Last Pap Diagnosis  Date Value Ref Range Status  09/16/2019   Final   - Negative for intraepithelial lesion or malignancy (NILM)    Meryl Crutch, RN 08/13/23 at

## 2023-08-13 ENCOUNTER — Telehealth (INDEPENDENT_AMBULATORY_CARE_PROVIDER_SITE_OTHER): Payer: BLUE CROSS/BLUE SHIELD

## 2023-08-13 DIAGNOSIS — Z3A10 10 weeks gestation of pregnancy: Secondary | ICD-10-CM

## 2023-08-13 DIAGNOSIS — Z3490 Encounter for supervision of normal pregnancy, unspecified, unspecified trimester: Secondary | ICD-10-CM | POA: Insufficient documentation

## 2023-08-13 DIAGNOSIS — Z3401 Encounter for supervision of normal first pregnancy, first trimester: Secondary | ICD-10-CM | POA: Diagnosis not present

## 2023-08-13 DIAGNOSIS — Z3491 Encounter for supervision of normal pregnancy, unspecified, first trimester: Secondary | ICD-10-CM

## 2023-08-21 ENCOUNTER — Other Ambulatory Visit: Payer: Self-pay

## 2023-08-21 ENCOUNTER — Ambulatory Visit: Payer: BLUE CROSS/BLUE SHIELD | Admitting: Family Medicine

## 2023-08-21 ENCOUNTER — Other Ambulatory Visit (HOSPITAL_COMMUNITY)
Admission: RE | Admit: 2023-08-21 | Discharge: 2023-08-21 | Disposition: A | Discharge status: Home / Self Care | Payer: BLUE CROSS/BLUE SHIELD | Source: Ambulatory Visit | Attending: Family Medicine | Admitting: Family Medicine

## 2023-08-21 DIAGNOSIS — Z3401 Encounter for supervision of normal first pregnancy, first trimester: Secondary | ICD-10-CM

## 2023-08-21 DIAGNOSIS — Z3491 Encounter for supervision of normal pregnancy, unspecified, first trimester: Secondary | ICD-10-CM | POA: Diagnosis present

## 2023-08-21 DIAGNOSIS — Z31430 Encounter of female for testing for genetic disease carrier status for procreative management: Secondary | ICD-10-CM

## 2023-08-21 DIAGNOSIS — Z3A11 11 weeks gestation of pregnancy: Secondary | ICD-10-CM | POA: Diagnosis not present

## 2023-08-21 DIAGNOSIS — Z1332 Encounter for screening for maternal depression: Secondary | ICD-10-CM | POA: Diagnosis not present

## 2023-08-21 NOTE — Progress Notes (Unsigned)
Subjective:   Pamela Wong is a 27 y.o. G1P0000 at [redacted]w[redacted]d by {Ob dating:14516} being seen today for her first obstetrical visit.  Her obstetrical history is significant for {ob risk factors:10154}. Patient {does/does not:19097} intend to breast feed. Pregnancy history fully reviewed.  Patient reports {sx:14538}.  HISTORY: OB History  Gravida Para Term Preterm AB Living  1 0 0 0 0 0  SAB IAB Ectopic Multiple Live Births  0 0 0 0 0    # Outcome Date GA Lbr Len/2nd Weight Sex Type Anes PTL Lv  1 Current            Last pap smear was  *** and was {Normal/Abnormal Appearance:21344::"normal"} Past Medical History:  Diagnosis Date   Asthma    Depression    Fracture, ankle    rt and left when young   Hidradenitis suppurativa    PID (pelvic inflammatory disease) 03/2023   Trichomonas infection    UTI (urinary tract infection)    Past Surgical History:  Procedure Laterality Date   NO PAST SURGERIES     Family History  Problem Relation Age of Onset   Hypertension Mother    Other Father        unknown hx   Social History   Tobacco Use   Smoking status: Every Day    Types: Cigars   Smokeless tobacco: Never   Tobacco comments:    Black and milds  Vaping Use   Vaping status: Some Days   Substances: THC  Substance Use Topics   Alcohol use: Not Currently    Comment: "sometimes" - last was a couple wks ago   Drug use: Yes    Types: Marijuana    Comment: marijuana last 08/13/23; stopped cocaine 08/10/23   No Known Allergies Current Outpatient Medications on File Prior to Visit  Medication Sig Dispense Refill   albuterol (VENTOLIN HFA) 108 (90 Base) MCG/ACT inhaler Inhale 1-2 puffs into the lungs every 6 (six) hours as needed for wheezing or shortness of breath. 18 g 2   ondansetron (ZOFRAN-ODT) 8 MG disintegrating tablet Take 1 tablet (8 mg total) by mouth every 8 (eight) hours as needed. 30 tablet 3   Prenatal MV & Min w/FA-DHA (PRENATAL ADULT GUMMY/DHA/FA PO) Take  1 tablet by mouth daily.     promethazine (PHENERGAN) 25 MG tablet Take 1 tablet (25 mg total) by mouth every 6 (six) hours as needed for nausea or vomiting. 30 tablet 3   No current facility-administered medications on file prior to visit.     Exam   There were no vitals filed for this visit.    Uterus:     Pelvic Exam: Perineum: no hemorrhoids, normal perineum   Vulva: normal external genitalia, no lesions   Vagina:  normal mucosa, normal discharge   Cervix: no lesions and normal, pap smear done.    Adnexa: normal adnexa and no mass, fullness, tenderness   Bony Pelvis: average  System: General: well-developed, well-nourished female in no acute distress   Breast:  normal appearance, no masses or tenderness   Skin: normal coloration and turgor, no rashes   Neurologic: oriented, normal, negative, normal mood   Extremities: normal strength, tone, and muscle mass, ROM of all joints is normal   HEENT PERRLA, extraocular movement intact and sclera clear, anicteric   Mouth/Teeth mucous membranes moist, pharynx normal without lesions and dental hygiene good   Neck supple and no masses   Cardiovascular: regular rate and  rhythm   Respiratory:  no respiratory distress, normal breath sounds   Abdomen: soft, non-tender; bowel sounds normal; no masses,  no organomegaly     Assessment:   Pregnancy: G1P0000 Patient Active Problem List   Diagnosis Date Noted   Encounter for supervision of low-risk pregnancy 08/13/2023     Plan:  1. Encounter for supervision of low-risk pregnancy in first trimester (Primary) FHR appropriate today via ultrasound BP appropriate today Pap smear collected, pinkish tinged discharge, no bleeding from the cervix or cervical os appreciated, discussed continue monitoring - Cytology - PAP( [place]) - CBC/D/Plt+RPR+Rh+ABO+RubIgG... - Culture, OB Urine - Hemoglobin A1c - Cervicovaginal ancillary only( [place])  2. [redacted] weeks gestation of pregnancy  3.  Encounter for supervision of normal first pregnancy in first trimester - PANORAMA PRENATAL TEST  4. Encounter of female for testing for genetic disease carrier status for procreative management - HORIZON Basic Panel   Initial labs drawn. Continue prenatal vitamins. Genetic Screening discussed, {Blank multiple:19196::"First trimester screen","Quad screen","NIPS"}: {requests/ordered/declines:14581}. Ultrasound discussed; fetal anatomic survey: {requests/ordered/declines:14581}. Problem list reviewed and updated. The nature of [facility] with multiple MDs and other Advanced Practice Providers was explained to patient; also emphasized that residents, students are part of our team. Routine obstetric precautions reviewed. No follow-ups on file.

## 2023-08-22 LAB — CERVICOVAGINAL ANCILLARY ONLY
Bacterial Vaginitis (gardnerella): POSITIVE — AB
Candida Glabrata: NEGATIVE
Candida Vaginitis: NEGATIVE
Comment: NEGATIVE
Comment: NEGATIVE
Comment: NEGATIVE
Comment: NEGATIVE
Trichomonas: NEGATIVE

## 2023-08-22 LAB — CBC/D/PLT+RPR+RH+ABO+RUBIGG...
Antibody Screen: NEGATIVE
Basophils Absolute: 0 10*3/uL (ref 0.0–0.2)
Basos: 1 %
EOS (ABSOLUTE): 0.2 10*3/uL (ref 0.0–0.4)
Eos: 3 %
HCV Ab: NONREACTIVE
HIV Screen 4th Generation wRfx: NONREACTIVE
Hematocrit: 37.9 % (ref 34.0–46.6)
Hemoglobin: 12.4 g/dL (ref 11.1–15.9)
Hepatitis B Surface Ag: NEGATIVE
Immature Grans (Abs): 0 10*3/uL (ref 0.0–0.1)
Immature Granulocytes: 0 %
Lymphocytes Absolute: 2.5 10*3/uL (ref 0.7–3.1)
Lymphs: 30 %
MCH: 30.3 pg (ref 26.6–33.0)
MCHC: 32.7 g/dL (ref 31.5–35.7)
MCV: 93 fL (ref 79–97)
Monocytes Absolute: 0.6 10*3/uL (ref 0.1–0.9)
Monocytes: 7 %
Neutrophils Absolute: 4.9 10*3/uL (ref 1.4–7.0)
Neutrophils: 59 %
Platelets: 215 10*3/uL (ref 150–450)
RBC: 4.09 x10E6/uL (ref 3.77–5.28)
RDW: 13.6 % (ref 11.7–15.4)
RPR Ser Ql: NONREACTIVE
Rh Factor: POSITIVE
Rubella Antibodies, IGG: 4.28 {index} (ref >0.99)
WBC: 8.3 10*3/uL (ref 3.4–10.8)

## 2023-08-22 LAB — CYTOLOGY - PAP
Chlamydia: NEGATIVE
Comment: NEGATIVE
Comment: NORMAL
Diagnosis: NEGATIVE
Neisseria Gonorrhea: NEGATIVE

## 2023-08-22 LAB — HEMOGLOBIN A1C
Est. average glucose Bld gHb Est-mCnc: 105 mg/dL
Hgb A1c MFr Bld: 5.3 % (ref 4.8–5.6)

## 2023-08-22 LAB — HCV INTERPRETATION

## 2023-08-22 MED ORDER — METRONIDAZOLE 500 MG PO TABS: 500.0000 mg | ORAL_TABLET | Freq: Two times a day (BID) | ORAL | 0 refills | Status: DC

## 2023-08-22 MED ORDER — ASPIRIN 81 MG PO TBEC: 81.0000 mg | DELAYED_RELEASE_TABLET | Freq: Every day | ORAL | 12 refills | Status: DC

## 2023-08-23 LAB — URINE CULTURE, OB REFLEX

## 2023-08-23 LAB — CULTURE, OB URINE

## 2023-08-29 LAB — PANORAMA PRENATAL TEST FULL PANEL:PANORAMA TEST PLUS 5 ADDITIONAL MICRODELETIONS: FETAL FRACTION: 12.9

## 2023-09-02 ENCOUNTER — Inpatient Hospital Stay (HOSPITAL_COMMUNITY)
Admission: AD | Admit: 2023-09-02 | Discharge: 2023-09-02 | Disposition: A | Discharge status: Home / Self Care | Payer: BLUE CROSS/BLUE SHIELD | Attending: Obstetrics & Gynecology | Admitting: Obstetrics & Gynecology

## 2023-09-02 ENCOUNTER — Other Ambulatory Visit: Payer: Self-pay

## 2023-09-02 DIAGNOSIS — Z3491 Encounter for supervision of normal pregnancy, unspecified, first trimester: Secondary | ICD-10-CM

## 2023-09-02 DIAGNOSIS — Z3A13 13 weeks gestation of pregnancy: Secondary | ICD-10-CM | POA: Insufficient documentation

## 2023-09-02 DIAGNOSIS — K047 Periapical abscess without sinus: Secondary | ICD-10-CM | POA: Diagnosis not present

## 2023-09-02 DIAGNOSIS — O26891 Other specified pregnancy related conditions, first trimester: Secondary | ICD-10-CM | POA: Diagnosis not present

## 2023-09-02 DIAGNOSIS — N760 Acute vaginitis: Secondary | ICD-10-CM | POA: Insufficient documentation

## 2023-09-02 DIAGNOSIS — B9689 Other specified bacterial agents as the cause of diseases classified elsewhere: Secondary | ICD-10-CM

## 2023-09-02 DIAGNOSIS — O219 Vomiting of pregnancy, unspecified: Secondary | ICD-10-CM

## 2023-09-02 DIAGNOSIS — O4691 Antepartum hemorrhage, unspecified, first trimester: Secondary | ICD-10-CM | POA: Diagnosis not present

## 2023-09-02 LAB — WET PREP, GENITAL
Sperm: NONE SEEN
Trich, Wet Prep: NONE SEEN
WBC, Wet Prep HPF POC: <10 (ref <10)
Yeast Wet Prep HPF POC: NONE SEEN

## 2023-09-02 LAB — URINALYSIS, ROUTINE W REFLEX MICROSCOPIC
Bacteria, UA: NONE SEEN
Bilirubin Urine: NEGATIVE
Glucose, UA: NEGATIVE mg/dL
Ketones, ur: NEGATIVE mg/dL
Leukocytes,Ua: NEGATIVE
Nitrite: NEGATIVE
Protein, ur: NEGATIVE mg/dL
Specific Gravity, Urine: 1.025 (ref 1.005–1.030)
pH: 6 (ref 5.0–8.0)

## 2023-09-02 MED ORDER — ONDANSETRON HCL 4 MG PO TABS: 4.0000 mg | ORAL_TABLET | Freq: Three times a day (TID) | ORAL | 2 refills | Status: DC | PRN

## 2023-09-02 MED ORDER — OXYCODONE-ACETAMINOPHEN 5-325 MG PO TABS: 1.0000 | ORAL_TABLET | Freq: Four times a day (QID) | ORAL | 0 refills | Status: DC | PRN

## 2023-09-02 MED ORDER — AMOXICILLIN-POT CLAVULANATE 875-125 MG PO TABS: 1.0000 | ORAL_TABLET | Freq: Two times a day (BID) | ORAL | 0 refills | Status: DC

## 2023-09-02 NOTE — MAU Provider Note (Signed)
None     S [name] is a 27 y.o. G1P0000 pregnant/non-pregnant female at [redacted]w[redacted]d who presents to MAU today with complaint of lower abdominal pain and small vaginal bleeding starting around 0000. She reports seeing blood on toilet paper when wiping. She reports she is currently being treated for bacterial vaginosis but is approximately halfway through treatment. She noted it is hard to take her pills because of nausea and tooth pain.    Receives care at [facility]. Prenatal records reviewed.  Pertinent items noted in HPI and remainder of comprehensive ROS otherwise negative.   O BP 134/69 (BP Location: Left Arm)   Pulse 77   Temp 98.7 F (37.1 C) (Oral)   Resp 18   Ht 5\' 6"  (1.676 m)   Wt 98.9 kg   LMP 06/03/2023   SpO2 100%   BMI 35.20 kg/m  Physical Exam Vitals reviewed.  Constitutional:      General: She is not in acute distress.    Appearance: Normal appearance. She is well-developed. She is not ill-appearing, toxic-appearing or diaphoretic.  HENT:     Head: Normocephalic.  Cardiovascular:     Rate and Rhythm: Normal rate.     Pulses: Normal pulses.     Heart sounds: Normal heart sounds.  Pulmonary:     Effort: Pulmonary effort is normal.  Abdominal:     Comments: Pelvic pain   Genitourinary:    Vagina: Bleeding present.     Comments: Scant bleeding  Skin:    General: Skin is warm and dry.     Capillary Refill: Capillary refill takes less than 2 seconds.  Neurological:     Mental Status: She is alert and oriented to person, place, and time.  Psychiatric:        Mood and Affect: Mood normal.        Behavior: Behavior normal.        Thought Content: Thought content normal.        Judgment: Judgment normal.    Results for orders placed or performed during the hospital encounter of 09/02/23 (from the past 24 hours)  Urinalysis, Routine w reflex microscopic -Urine, Clean Catch     Status: Abnormal   Collection Time: 09/02/23  2:33 AM  Result Value Ref  Range   Color, Urine YELLOW YELLOW   APPearance HAZY (A) CLEAR   Specific Gravity, Urine 1.025 1.005 - 1.030   pH 6.0 5.0 - 8.0   Glucose, UA NEGATIVE NEGATIVE mg/dL   Hgb urine dipstick LARGE (A) NEGATIVE   Bilirubin Urine NEGATIVE NEGATIVE   Ketones, ur NEGATIVE NEGATIVE mg/dL   Protein, ur NEGATIVE NEGATIVE mg/dL   Nitrite NEGATIVE NEGATIVE   Leukocytes,Ua NEGATIVE NEGATIVE   RBC / HPF 0-5 0 - 5 RBC/hpf   WBC, UA 0-5 0 - 5 WBC/hpf   Bacteria, UA NONE SEEN NONE SEEN   Squamous Epithelial / HPF 6-10 0 - 5 /HPF   Mucus PRESENT   Wet prep, genital     Status: Abnormal   Collection Time: 09/02/23  3:18 AM  Result Value Ref Range   Yeast Wet Prep HPF POC NONE SEEN NONE SEEN   Trich, Wet Prep NONE SEEN NONE SEEN   Clue Cells Wet Prep HPF POC PRESENT (A) NONE SEEN   WBC, Wet Prep HPF POC <10 <10   Sperm NONE SEEN     MDM: Records reviewed Labs: Wet prep, UA Prescriptions sent  MAU Course:  A Bacterial vaginosis Dental abscess  Nausea of pregnancy Vaginal bleeding.   Pelvic pain and small vaginal bleeding likely secondary to bacterial vaginosis infection.  Medical screening exam complete  P Discharge from MAU in stable condition with routine precautions Dental letter provided. Prescriptions sent for nausea, dental pain/infection.  Advised patient to continue metronidazole prescription previously prescribed, return to MAU with recurrent or worsening problems. Offered to change to metrogel, patient declines.   Follow up at [facility] as scheduled for ongoing prenatal care  Future Appointments  Date Time Provider Department Center  10/15/2023  2:15 PM [facility] NURSE [facility]  10/15/2023  2:30 PM [facility] US2 [facility]   Allergies as of 09/02/2023   No Known Allergies      Medication List     TAKE these medications    albuterol 108 (90 Base) MCG/ACT inhaler Commonly known as: VENTOLIN HFA Inhale 1-2 puffs into the lungs every 6 (six) hours as needed for  wheezing or shortness of breath.   amoxicillin-clavulanate 875-125 MG tablet Commonly known as: AUGMENTIN Take 1 tablet by mouth every 12 (twelve) hours.   aspirin EC 81 MG tablet Take 1 tablet (81 mg total) by mouth daily. Swallow whole.   metroNIDAZOLE 500 MG tablet Commonly known as: FLAGYL Take 1 tablet (500 mg total) by mouth 2 (two) times daily.   ondansetron 4 MG tablet Commonly known as: ZOFRAN Take 1 tablet (4 mg total) by mouth every 8 (eight) hours as needed for nausea or vomiting.   ondansetron 8 MG disintegrating tablet Commonly known as: ZOFRAN-ODT Take 1 tablet (8 mg total) by mouth every 8 (eight) hours as needed.   oxyCODONE-acetaminophen 5-325 MG tablet Commonly known as: PERCOCET/ROXICET Take 1 tablet by mouth every 6 (six) hours as needed for severe pain (pain score 7-10).   PRENATAL ADULT GUMMY/DHA/FA PO Take 1 tablet by mouth daily.   promethazine 25 MG tablet Commonly known as: PHENERGAN Take 1 tablet (25 mg total) by mouth every 6 (six) hours as needed for nausea or vomiting.        Pamela Wong, CNM 09/02/2023 4:47 AM

## 2023-09-02 NOTE — Discharge Instructions (Signed)

## 2023-09-02 NOTE — MAU Note (Signed)
.  Pamela Wong is a 27 y.o. at [redacted]w[redacted]d here in MAU reporting: intermittent lower abdominal cramping and VB that started around 0000. Saw small blood clot in the toilet and had blood on tissue when wiping. Last intercourse was last week.   Onset of complaint: 0000 Pain score: 7 Vitals:   09/02/23 0229  BP: 133/69  Pulse: 92  Resp: 19  Temp: 98.1 F (36.7 C)  SpO2: 99%     FHT:143 Lab orders placed from triage: UA

## 2023-09-03 LAB — GC/CHLAMYDIA PROBE AMP (~~LOC~~) NOT AT ARMC
Chlamydia: NEGATIVE
Comment: NEGATIVE
Comment: NORMAL
Neisseria Gonorrhea: NEGATIVE

## 2023-09-07 LAB — HORIZON CUSTOM: REPORT SUMMARY: POSITIVE — AB

## 2023-09-26 ENCOUNTER — Encounter: Payer: BLUE CROSS/BLUE SHIELD | Admitting: Family Medicine

## 2023-10-02 ENCOUNTER — Telehealth: Payer: Self-pay | Admitting: Family Medicine

## 2023-10-02 ENCOUNTER — Ambulatory Visit (INDEPENDENT_AMBULATORY_CARE_PROVIDER_SITE_OTHER): Payer: BLUE CROSS/BLUE SHIELD | Admitting: Family Medicine

## 2023-10-02 ENCOUNTER — Other Ambulatory Visit: Payer: Self-pay

## 2023-10-02 VITALS — BP 125/67 | HR 79 | Wt 216.0 lb

## 2023-10-02 DIAGNOSIS — Z3A17 17 weeks gestation of pregnancy: Secondary | ICD-10-CM | POA: Diagnosis not present

## 2023-10-02 DIAGNOSIS — Z3491 Encounter for supervision of normal pregnancy, unspecified, first trimester: Secondary | ICD-10-CM

## 2023-10-02 DIAGNOSIS — Z3492 Encounter for supervision of normal pregnancy, unspecified, second trimester: Secondary | ICD-10-CM | POA: Diagnosis not present

## 2023-10-02 MED ORDER — PRENATAL VITAMIN 27-0.8 MG PO TABS: 1.0000 | ORAL_TABLET | Freq: Every day | ORAL | 11 refills | Status: DC

## 2023-10-02 NOTE — Progress Notes (Signed)
   PRENATAL VISIT NOTE  Subjective:  Pamela Wong is a 28 y.o. G1P0000 at [redacted]w[redacted]d being seen today for ongoing prenatal care.  She is currently monitored for the following issues for this low-risk pregnancy and has Encounter for supervision of low-risk pregnancy on their problem list.  Patient reports no bleeding, no contractions, no cramping, and no leaking.  Contractions: Not present. Vag. Bleeding: None.  Movement: Present. Denies leaking of fluid.   The following portions of the patient's history were reviewed and updated as appropriate: allergies, current medications, past family history, past medical history, past social history, past surgical history and problem list.   Objective:   Vitals:   10/02/23 1629  BP: 125/67  Pulse: 79  Weight: 216 lb (98 kg)    Fetal Status: Fetal Heart Rate (bpm): 147   Movement: Present     General:  Alert, oriented and cooperative. Patient is in no acute distress.  Skin: Skin is warm and dry. No rash noted.   Cardiovascular: Normal heart rate noted  Respiratory: Normal respiratory effort, no problems with respiration noted  Abdomen: Soft, gravid, appropriate for gestational age.  Pain/Pressure: Absent     Pelvic: Cervical exam deferred        Extremities: Normal range of motion.  Edema: None  Mental Status: Normal mood and affect. Normal behavior. Normal judgment and thought content.   Assessment and Plan:  Pregnancy: G1P0000 at [redacted]w[redacted]d 1. Encounter for supervision of low-risk pregnancy in first trimester FHR and BP appropriate today Continue routine prenatal care  2. [redacted] weeks gestation of pregnancy (Primary)   Preterm labor symptoms and general obstetric precautions including but not limited to vaginal bleeding, contractions, leaking of fluid and fetal movement were reviewed in detail with the patient. Please refer to After Visit Summary for other counseling recommendations.   No follow-ups on file.  Future Appointments  Date Time  Provider Department Center  10/15/2023  2:15 PM [facility] NURSE [facility]  10/15/2023  2:30 PM [facility] US2 [facility]    Celedonio Savage, MD

## 2023-10-02 NOTE — Telephone Encounter (Signed)
Patient was told that she was out of network with [place] and if she had her insurance card to help her see where see was in network with and she did not, she then got on the phone to speak and told the person on the phone "Isnt this suppose to be the free clinic". She then said she was going to get pregnancy medicaid and I explained that she still had the BCBS as primary and medicaid would not pay for nothing because BCBS wouldn't pay. She got back on the phone and wouldn't let me finish talking to her and just rolled her eyes. She started telling the person on the phone that she was irritated and that it was our fault because she was never told and she was OON and that she was not going anywhere else and not going to receive prenatal care. She was then called by [facility] to the back for her appt.

## 2023-10-08 DIAGNOSIS — O99210 Obesity complicating pregnancy, unspecified trimester: Secondary | ICD-10-CM | POA: Insufficient documentation

## 2023-10-08 DIAGNOSIS — D563 Thalassemia minor: Secondary | ICD-10-CM | POA: Insufficient documentation

## 2023-10-15 ENCOUNTER — Ambulatory Visit: Payer: Medicaid Other | Attending: Family Medicine

## 2023-10-15 ENCOUNTER — Encounter: Payer: Self-pay | Admitting: *Deleted

## 2023-10-15 ENCOUNTER — Ambulatory Visit (HOSPITAL_BASED_OUTPATIENT_CLINIC_OR_DEPARTMENT_OTHER): Payer: Medicaid Other

## 2023-10-15 ENCOUNTER — Other Ambulatory Visit: Payer: Self-pay

## 2023-10-15 ENCOUNTER — Ambulatory Visit: Payer: Medicaid Other | Admitting: *Deleted

## 2023-10-15 ENCOUNTER — Ambulatory Visit: Payer: Medicaid Other

## 2023-10-15 VITALS — BP 116/71 | HR 86

## 2023-10-15 DIAGNOSIS — O99320 Drug use complicating pregnancy, unspecified trimester: Secondary | ICD-10-CM | POA: Insufficient documentation

## 2023-10-15 DIAGNOSIS — O99330 Smoking (tobacco) complicating pregnancy, unspecified trimester: Secondary | ICD-10-CM | POA: Insufficient documentation

## 2023-10-15 DIAGNOSIS — Z3A19 19 weeks gestation of pregnancy: Secondary | ICD-10-CM

## 2023-10-15 DIAGNOSIS — Z3401 Encounter for supervision of normal first pregnancy, first trimester: Secondary | ICD-10-CM

## 2023-10-15 DIAGNOSIS — O359XX0 Maternal care for (suspected) fetal abnormality and damage, unspecified, not applicable or unspecified: Secondary | ICD-10-CM

## 2023-10-15 DIAGNOSIS — D563 Thalassemia minor: Secondary | ICD-10-CM

## 2023-10-15 DIAGNOSIS — O99322 Drug use complicating pregnancy, second trimester: Secondary | ICD-10-CM | POA: Insufficient documentation

## 2023-10-15 DIAGNOSIS — Z363 Encounter for antenatal screening for malformations: Secondary | ICD-10-CM | POA: Diagnosis not present

## 2023-10-15 DIAGNOSIS — O99210 Obesity complicating pregnancy, unspecified trimester: Secondary | ICD-10-CM

## 2023-10-15 DIAGNOSIS — O35BXX0 Maternal care for other (suspected) fetal abnormality and damage, fetal cardiac anomalies, not applicable or unspecified: Secondary | ICD-10-CM

## 2023-10-15 DIAGNOSIS — F1290 Cannabis use, unspecified, uncomplicated: Secondary | ICD-10-CM | POA: Diagnosis present

## 2023-10-15 DIAGNOSIS — O99212 Obesity complicating pregnancy, second trimester: Secondary | ICD-10-CM | POA: Insufficient documentation

## 2023-10-15 NOTE — Progress Notes (Unsigned)
Patient information  Patient Name: Pamela Wong  Patient MRN:   [id number]  Referring practice: MFM Referring Provider: [facility])  MFM CONSULT  Pamela Wong is a 28 y.o. G1P0000 at [redacted]w[redacted]d here for ultrasound and consultation. Patient Active Problem List   Diagnosis Date Noted   Marijuana use during pregnancy 10/15/2023   Tobacco use affecting pregnancy, antepartum 10/15/2023   Obesity affecting pregnancy, antepartum 10/08/2023   Alpha thalassemia silent carrier 10/08/2023   Encounter for supervision of low-risk pregnancy 08/13/2023   Pamela Wong is doing well today with no acute concerns. She denies contractions, bleeding, or loss of fluid and reports good fetal movement.   RE possible VSD: Concern for a muscular VSD located was seen on today's ultrasound. The remainder of the fetal cardiac structures that were well visualized appear normal with normal ventricular thickness and wall motion function. We discussed the various sizes and locations of VSD in relationship to prognosis. Fortuitously the most common forms of VSD (muscular and membranous) close spontaneously, either prenatally or postnatally. Conversely, inlet defects are seen in conjunction with abnormalities of the mitral and tricuspid valves represent an AVC type of VSD. This type of VSD does not close spontaneously and usually requires surgery. We discussed that even a small muscular VSD appears to increase the risk for additional cardiac abnormalities. However, the remainder of the cardiac anatomy appears normal. Isolated small muscular VSD increases the risk for a clinically significant genetic syndrome remains controversial.  Small or moderate muscular defects may close prenatally or within a few years after delivery. Large muscular defects manifest in infancy with tachypnea and failure to thrive and may require surgical closure during the first year of life. Because muscular  defects occur in a heavily trabeculated portion of the ventricular septum, effective surgical closure can be difficult to achieve. Alternative approaches include pulmonary artery banding, followed by removal of the band once the defect has decreased in size; rarely, such VSDs may be closed percutaneously with a device. The long-term prognosis is usually excellent unless delayed surgery has allowed the development of pulmonary vascular disease.  Sonographic findings Single intrauterine pregnancy at 19w 1d  Fetal cardiac activity:  Observed and appears normal. Presentation: Cephalic. The anatomic structures that were well seen appear normal without evidence of soft markers. Due to poor acoustic windows some structures remain suboptimally visualized. Fetal biometry shows the estimated fetal weight at the 94 percentile.  Amniotic fluid: Within normal limits.  MVP: 4.54 cm. Placenta: Anterior. Adnexa: No abnormality visualized. Cervical length: 3.9 cm.  There are limitations of prenatal ultrasound such as the inability to detect certain abnormalities due to poor visualization. Various factors such as fetal position, gestational age and maternal body habitus may increase the difficulty in visualizing the fetal anatomy.   Recommendations - Detailed anatomic survey was done today wtihout evidence of other structural defects - Genetic screening was done and was normal. Since there is no definitive evidence that isolated VSDs have an increased risk of aneuploidy, further genetic assessment is not indicated.  - Fetal echo was ordered to be done at [facility]  - Antenatal testing is not indicated for an isolated VSD - Delivery timing and route does not need to be altered - Notify pediatric team about the defect and need for postnatal echo - She will be added to our departmental [facility] list  Review of Systems: A review of systems was performed and was negative except per HPI  Vitals and Physical Exam     10/15/2023    2:42 PM 10/02/2023    4:29 PM 09/02/2023    4:44 AM  Vitals with BMI  Weight  216 lbs   Systolic 116 125 478  Diastolic 71 67 69  Pulse 86 79 77  Sitting comfortably on the sonogram table Nonlabored breathing Normal rate and rhythm Abdomen is nontender  Past pregnancies OB History  Gravida Para Term Preterm AB Living  1    0 0  SAB IAB Ectopic Multiple Live Births    0  0    # Outcome Date GA Lbr Len/2nd Weight Sex Type Anes PTL Lv  1 Current             I spent 30 minutes reviewing the patients chart, including labs and images as well as counseling the patient about her medical conditions. Greater than 50% of the time was spent in direct face-to-face patient counseling.  Braxton Feathers  MFM, [place]   10/15/2023  3:30 PM

## 2023-10-16 ENCOUNTER — Other Ambulatory Visit: Payer: Self-pay | Admitting: *Deleted

## 2023-10-16 DIAGNOSIS — Z362 Encounter for other antenatal screening follow-up: Secondary | ICD-10-CM

## 2023-10-16 DIAGNOSIS — O99322 Drug use complicating pregnancy, second trimester: Secondary | ICD-10-CM

## 2023-10-16 DIAGNOSIS — Q210 Ventricular septal defect: Secondary | ICD-10-CM

## 2023-10-17 DIAGNOSIS — O35BXX0 Maternal care for other (suspected) fetal abnormality and damage, fetal cardiac anomalies, not applicable or unspecified: Secondary | ICD-10-CM | POA: Insufficient documentation

## 2023-10-30 ENCOUNTER — Other Ambulatory Visit: Payer: Self-pay

## 2023-10-30 ENCOUNTER — Ambulatory Visit: Payer: Medicaid Other | Admitting: Obstetrics and Gynecology

## 2023-10-30 ENCOUNTER — Encounter: Payer: Self-pay | Admitting: Family Medicine

## 2023-10-30 ENCOUNTER — Encounter: Payer: Self-pay | Admitting: Obstetrics and Gynecology

## 2023-10-30 VITALS — BP 115/72 | HR 88 | Wt 218.0 lb

## 2023-10-30 DIAGNOSIS — O99210 Obesity complicating pregnancy, unspecified trimester: Secondary | ICD-10-CM

## 2023-10-30 DIAGNOSIS — O99332 Smoking (tobacco) complicating pregnancy, second trimester: Secondary | ICD-10-CM

## 2023-10-30 DIAGNOSIS — F1290 Cannabis use, unspecified, uncomplicated: Secondary | ICD-10-CM

## 2023-10-30 DIAGNOSIS — O35BXX0 Maternal care for other (suspected) fetal abnormality and damage, fetal cardiac anomalies, not applicable or unspecified: Secondary | ICD-10-CM

## 2023-10-30 DIAGNOSIS — O99212 Obesity complicating pregnancy, second trimester: Secondary | ICD-10-CM

## 2023-10-30 DIAGNOSIS — D563 Thalassemia minor: Secondary | ICD-10-CM

## 2023-10-30 DIAGNOSIS — Z3A21 21 weeks gestation of pregnancy: Secondary | ICD-10-CM

## 2023-10-30 DIAGNOSIS — O99322 Drug use complicating pregnancy, second trimester: Secondary | ICD-10-CM

## 2023-10-30 DIAGNOSIS — O99320 Drug use complicating pregnancy, unspecified trimester: Secondary | ICD-10-CM

## 2023-10-30 DIAGNOSIS — Z3492 Encounter for supervision of normal pregnancy, unspecified, second trimester: Secondary | ICD-10-CM

## 2023-10-30 DIAGNOSIS — O99330 Smoking (tobacco) complicating pregnancy, unspecified trimester: Secondary | ICD-10-CM

## 2023-10-30 MED ORDER — ONDANSETRON 8 MG PO TBDP: 8.0000 mg | ORAL_TABLET | Freq: Three times a day (TID) | ORAL | 3 refills | Status: DC | PRN

## 2023-10-30 NOTE — Progress Notes (Signed)
   PRENATAL VISIT NOTE  Subjective:  Pamela Wong is a 28 y.o. G1P0000 at [redacted]w[redacted]d being seen today for ongoing prenatal care.  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  Patient reports no complaints.  Contractions: Not present. Vag. Bleeding: None.  Movement: Present. Denies leaking of fluid.   The following portions of the patient's history were reviewed and updated as appropriate: allergies, current medications, past family history, past medical history, past social history, past surgical history and problem list.   Objective:   Vitals:   10/30/23 1615  BP: 115/72  Pulse: 88  Weight: 218 lb (98.9 kg)    Fetal Status: Fetal Heart Rate (bpm): 150   Movement: Present     General:  Alert, oriented and cooperative. Patient is in no acute distress.  Skin: Skin is warm and dry. No rash noted.   Cardiovascular: Normal heart rate noted  Respiratory: Normal respiratory effort, no problems with respiration noted  Abdomen: Soft, gravid, appropriate for gestational age.  Pain/Pressure: Absent     Pelvic: Cervical exam deferred        Extremities: Normal range of motion.  Edema: None  Mental Status: Normal mood and affect. Normal behavior. Normal judgment and thought content.   Assessment and Plan:  Pregnancy: G1P0000 at 103w2d 1. Tobacco use affecting pregnancy, antepartum (Primary) Down to 0-1 cpd. Used to smoke 1 ppd.   2. Obesity affecting pregnancy, antepartum, unspecified obesity type  3. Marijuana use during pregnancy  4. Encounter for supervision of low-risk pregnancy in second trimester AFP wnl  5. Alpha thalassemia silent carrier Partner  not involved.   6. 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 Appt is on the 18th.   Preterm labor symptoms and general obstetric precautions including but not limited to vaginal bleeding, contractions, leaking of fluid and fetal movement were reviewed in detail with the patient. Please refer to After Visit Summary for other counseling recommendations.   Return in about 4 weeks (around 11/27/2023) for OB VISIT, MD or APP.  Future Appointments  Date Time Provider Department Center  11/19/2023  2:30 PM [facility] US5 [facility]  11/27/2023 10:15 AM [facility]    Milas Hock, MD

## 2023-10-31 ENCOUNTER — Encounter: Payer: Self-pay | Admitting: Obstetrics and Gynecology

## 2023-11-13 ENCOUNTER — Inpatient Hospital Stay (HOSPITAL_COMMUNITY)

## 2023-11-13 ENCOUNTER — Inpatient Hospital Stay (HOSPITAL_COMMUNITY)
Admission: AD | Admit: 2023-11-13 | Discharge: 2023-11-24 | DRG: 786 | Disposition: A | Discharge status: Home / Self Care | Attending: Family Medicine | Admitting: Family Medicine

## 2023-11-13 ENCOUNTER — Encounter (HOSPITAL_COMMUNITY): Payer: Self-pay | Admitting: Obstetrics & Gynecology

## 2023-11-13 DIAGNOSIS — O35BXX0 Maternal care for other (suspected) fetal abnormality and damage, fetal cardiac anomalies, not applicable or unspecified: Secondary | ICD-10-CM

## 2023-11-13 DIAGNOSIS — Z148 Genetic carrier of other disease: Secondary | ICD-10-CM

## 2023-11-13 DIAGNOSIS — O99332 Smoking (tobacco) complicating pregnancy, second trimester: Secondary | ICD-10-CM

## 2023-11-13 DIAGNOSIS — O9832 Other infections with a predominantly sexual mode of transmission complicating childbirth: Secondary | ICD-10-CM | POA: Diagnosis present

## 2023-11-13 DIAGNOSIS — D563 Thalassemia minor: Secondary | ICD-10-CM

## 2023-11-13 DIAGNOSIS — O99214 Obesity complicating childbirth: Secondary | ICD-10-CM | POA: Diagnosis present

## 2023-11-13 DIAGNOSIS — Z8751 Personal history of pre-term labor: Secondary | ICD-10-CM | POA: Diagnosis present

## 2023-11-13 DIAGNOSIS — Z3A23 23 weeks gestation of pregnancy: Secondary | ICD-10-CM | POA: Diagnosis not present

## 2023-11-13 DIAGNOSIS — Z8249 Family history of ischemic heart disease and other diseases of the circulatory system: Secondary | ICD-10-CM | POA: Diagnosis not present

## 2023-11-13 DIAGNOSIS — O99334 Smoking (tobacco) complicating childbirth: Secondary | ICD-10-CM | POA: Diagnosis present

## 2023-11-13 DIAGNOSIS — O4692 Antepartum hemorrhage, unspecified, second trimester: Secondary | ICD-10-CM | POA: Diagnosis not present

## 2023-11-13 DIAGNOSIS — O99212 Obesity complicating pregnancy, second trimester: Secondary | ICD-10-CM | POA: Diagnosis not present

## 2023-11-13 DIAGNOSIS — O99324 Drug use complicating childbirth: Secondary | ICD-10-CM | POA: Diagnosis present

## 2023-11-13 DIAGNOSIS — Z1152 Encounter for screening for COVID-19: Secondary | ICD-10-CM

## 2023-11-13 DIAGNOSIS — Z7982 Long term (current) use of aspirin: Secondary | ICD-10-CM | POA: Diagnosis not present

## 2023-11-13 DIAGNOSIS — O99322 Drug use complicating pregnancy, second trimester: Secondary | ICD-10-CM

## 2023-11-13 DIAGNOSIS — O321XX0 Maternal care for breech presentation, not applicable or unspecified: Secondary | ICD-10-CM | POA: Diagnosis present

## 2023-11-13 DIAGNOSIS — A568 Sexually transmitted chlamydial infection of other sites: Secondary | ICD-10-CM | POA: Diagnosis present

## 2023-11-13 DIAGNOSIS — F1290 Cannabis use, unspecified, uncomplicated: Secondary | ICD-10-CM | POA: Diagnosis present

## 2023-11-13 DIAGNOSIS — O4103X0 Oligohydramnios, third trimester, not applicable or unspecified: Secondary | ICD-10-CM | POA: Diagnosis present

## 2023-11-13 DIAGNOSIS — Z3492 Encounter for supervision of normal pregnancy, unspecified, second trimester: Principal | ICD-10-CM

## 2023-11-13 DIAGNOSIS — E669 Obesity, unspecified: Secondary | ICD-10-CM

## 2023-11-13 DIAGNOSIS — O99330 Smoking (tobacco) complicating pregnancy, unspecified trimester: Secondary | ICD-10-CM | POA: Diagnosis present

## 2023-11-13 DIAGNOSIS — Z3490 Encounter for supervision of normal pregnancy, unspecified, unspecified trimester: Secondary | ICD-10-CM

## 2023-11-13 DIAGNOSIS — Z3A24 24 weeks gestation of pregnancy: Secondary | ICD-10-CM | POA: Diagnosis not present

## 2023-11-13 DIAGNOSIS — A5602 Chlamydial vulvovaginitis: Secondary | ICD-10-CM | POA: Diagnosis present

## 2023-11-13 DIAGNOSIS — F17290 Nicotine dependence, other tobacco product, uncomplicated: Secondary | ICD-10-CM | POA: Diagnosis present

## 2023-11-13 DIAGNOSIS — Z98891 History of uterine scar from previous surgery: Secondary | ICD-10-CM

## 2023-11-13 DIAGNOSIS — O99012 Anemia complicating pregnancy, second trimester: Secondary | ICD-10-CM

## 2023-11-13 DIAGNOSIS — O99210 Obesity complicating pregnancy, unspecified trimester: Secondary | ICD-10-CM | POA: Diagnosis present

## 2023-11-13 LAB — WET PREP, GENITAL
Sperm: NONE SEEN
Trich, Wet Prep: NONE SEEN
WBC, Wet Prep HPF POC: >=10 — AB
Yeast Wet Prep HPF POC: NONE SEEN

## 2023-11-13 LAB — RAPID HIV SCREEN (HIV 1/2 AB+AG)
HIV 1/2 Antibodies: NONREACTIVE
HIV-1 P24 Antigen - HIV24: NONREACTIVE

## 2023-11-13 LAB — CBC
HCT: 33.3 % — ABNORMAL LOW (ref 36.0–46.0)
Hemoglobin: 11.1 g/dL — ABNORMAL LOW (ref 12.0–15.0)
MCH: 30.9 pg (ref 26.0–34.0)
MCHC: 33.3 g/dL (ref 30.0–36.0)
MCV: 92.8 fL (ref 80.0–100.0)
Platelets: 189 10*3/uL (ref 150–400)
RBC: 3.59 MIL/uL — ABNORMAL LOW (ref 3.87–5.11)
RDW: 13.4 % (ref 11.5–15.5)
WBC: 12.2 10*3/uL — ABNORMAL HIGH (ref 4.0–10.5)
nRBC: 0 % (ref 0.0–0.2)

## 2023-11-13 LAB — URINALYSIS, ROUTINE W REFLEX MICROSCOPIC
Bilirubin Urine: NEGATIVE
Glucose, UA: NEGATIVE mg/dL
Ketones, ur: NEGATIVE mg/dL
Nitrite: NEGATIVE
Protein, ur: NEGATIVE mg/dL
RBC / HPF: >50 RBC/hpf (ref 0–5)
Specific Gravity, Urine: 1.012 (ref 1.005–1.030)
pH: 8 (ref 5.0–8.0)

## 2023-11-13 LAB — TYPE AND SCREEN
ABO/RH(D): A POS
Antibody Screen: NEGATIVE

## 2023-11-13 LAB — RPR: RPR Ser Ql: NONREACTIVE

## 2023-11-13 MED ORDER — HYDROXYZINE HCL 50 MG PO TABS
50.0000 mg | ORAL_TABLET | Freq: Four times a day (QID) | ORAL | Status: DC | PRN
  Administered 2023-11-14 – 2023-11-19 (×9): 50 mg via ORAL
  Filled 2023-11-13 (×9): qty 1

## 2023-11-13 MED ORDER — AZITHROMYCIN 500 MG PO TABS
1000.0000 mg | ORAL_TABLET | Freq: Once | ORAL | Status: AC
  Administered 2023-11-13: 1000 mg via ORAL
  Filled 2023-11-13: qty 2

## 2023-11-13 MED ORDER — FENTANYL CITRATE (PF) 100 MCG/2ML IJ SOLN: 50.0000 ug | INTRAMUSCULAR | Status: DC | PRN

## 2023-11-13 MED ORDER — MAGNESIUM SULFATE BOLUS VIA INFUSION
4.0000 g | Freq: Once | INTRAVENOUS | Status: AC
  Administered 2023-11-13: 4 g via INTRAVENOUS
  Filled 2023-11-13: qty 1000

## 2023-11-13 MED ORDER — ACETAMINOPHEN 325 MG PO TABS: 650.0000 mg | ORAL_TABLET | ORAL | Status: DC | PRN

## 2023-11-13 MED ORDER — SODIUM CHLORIDE 0.9 % IV SOLN
2.0000 g | Freq: Four times a day (QID) | INTRAVENOUS | Status: DC
  Administered 2023-11-13: 2 g via INTRAVENOUS
  Filled 2023-11-13: qty 2000

## 2023-11-13 MED ORDER — INDOMETHACIN 25 MG PO CAPS
50.0000 mg | ORAL_CAPSULE | Freq: Once | ORAL | Status: AC
  Administered 2023-11-13: 50 mg via ORAL
  Filled 2023-11-13: qty 2

## 2023-11-13 MED ORDER — OXYCODONE-ACETAMINOPHEN 5-325 MG PO TABS
1.0000 | ORAL_TABLET | ORAL | Status: DC | PRN
  Administered 2023-11-17 (×2): 1 via ORAL
  Filled 2023-11-13 (×2): qty 1

## 2023-11-13 MED ORDER — INDOMETHACIN 25 MG PO CAPS
25.0000 mg | ORAL_CAPSULE | Freq: Four times a day (QID) | ORAL | Status: AC
  Administered 2023-11-13 – 2023-11-15 (×8): 25 mg via ORAL
  Filled 2023-11-13 (×11): qty 1

## 2023-11-13 MED ORDER — NICOTINE 7 MG/24HR TD PT24
7.0000 mg | MEDICATED_PATCH | Freq: Every day | TRANSDERMAL | Status: DC
  Administered 2023-11-13 – 2023-11-15 (×4): 7 mg via TRANSDERMAL
  Filled 2023-11-13 (×5): qty 1

## 2023-11-13 MED ORDER — MAGNESIUM SULFATE 40 GM/1000ML IV SOLN
2.0000 g/h | INTRAVENOUS | Status: DC
  Administered 2023-11-13 – 2023-11-14 (×2): 2 g/h via INTRAVENOUS
  Filled 2023-11-13 (×2): qty 1000

## 2023-11-13 MED ORDER — SOD CITRATE-CITRIC ACID 500-334 MG/5ML PO SOLN: 30.0000 mL | ORAL | Status: DC | PRN

## 2023-11-13 MED ORDER — OXYCODONE-ACETAMINOPHEN 5-325 MG PO TABS
2.0000 | ORAL_TABLET | ORAL | Status: DC | PRN
  Administered 2023-11-16 – 2023-11-20 (×7): 2 via ORAL
  Filled 2023-11-13 (×7): qty 2

## 2023-11-13 MED ORDER — OXYTOCIN-SODIUM CHLORIDE 30-0.9 UT/500ML-% IV SOLN: 2.5000 [IU]/h | INTRAVENOUS | Status: DC

## 2023-11-13 MED ORDER — LACTATED RINGERS IV SOLN: INTRAVENOUS | Status: AC

## 2023-11-13 MED ORDER — OXYTOCIN BOLUS FROM INFUSION: 333.0000 mL | Freq: Once | INTRAVENOUS | Status: DC

## 2023-11-13 MED ORDER — AMOXICILLIN 500 MG PO CAPS: 500.0000 mg | ORAL_CAPSULE | Freq: Three times a day (TID) | ORAL | Status: DC

## 2023-11-13 MED ORDER — PENICILLIN G POT IN DEXTROSE 60000 UNIT/ML IV SOLN
3.0000 10*6.[IU] | INTRAVENOUS | Status: DC
  Administered 2023-11-13 – 2023-11-14 (×4): 3 10*6.[IU] via INTRAVENOUS
  Filled 2023-11-13 (×4): qty 50

## 2023-11-13 MED ORDER — LIDOCAINE HCL (PF) 1 % IJ SOLN: 30.0000 mL | INTRAMUSCULAR | Status: DC | PRN

## 2023-11-13 MED ORDER — BETAMETHASONE SOD PHOS & ACET 6 (3-3) MG/ML IJ SUSP
12.0000 mg | INTRAMUSCULAR | Status: AC
  Administered 2023-11-13 – 2023-11-14 (×2): 12 mg via INTRAMUSCULAR
  Filled 2023-11-13: qty 5

## 2023-11-13 MED ORDER — SODIUM CHLORIDE 0.9 % IV SOLN
5.0000 10*6.[IU] | Freq: Once | INTRAVENOUS | Status: AC
  Administered 2023-11-13: 5 10*6.[IU] via INTRAVENOUS
  Filled 2023-11-13: qty 5

## 2023-11-13 MED ORDER — LACTATED RINGERS IV SOLN: 500.0000 mL | INTRAVENOUS | Status: AC | PRN

## 2023-11-13 MED ORDER — FLEET ENEMA RE ENEM: 1.0000 | ENEMA | RECTAL | Status: DC | PRN

## 2023-11-13 MED ORDER — ONDANSETRON HCL 4 MG/2ML IJ SOLN
4.0000 mg | Freq: Four times a day (QID) | INTRAMUSCULAR | Status: DC | PRN
  Administered 2023-11-13 – 2023-11-20 (×6): 4 mg via INTRAVENOUS
  Filled 2023-11-13 (×7): qty 2

## 2023-11-13 NOTE — MAU Note (Signed)
.  Pamela Wong is a 28 y.o. at [redacted]w[redacted]d here in MAU reporting: started having abd pain and cramping this morning then had yellow discharge that is now bloody. Pt stated she was exposed to her sister that has covid would like a test for that as well.  LMP:  Onset of complaint: this morning Pain score: 10 Vitals:   11/13/23 1024  BP: 127/74  Pulse: 85  Resp: 18  Temp: 98 F (36.7 C)     FHT: 147  Lab orders placed from triage: u/a

## 2023-11-13 NOTE — Progress Notes (Signed)
 Labor Progress Note  Pamela Wong is a 28 y.o. G1P0000 at [redacted]w[redacted]d presented for PTL.   S: pt resting comfortably in bed in trendelenburg position.  No concerns at this time.  Intermittently tearful as understandably worried about prognosis of newborn at this gestational age.   O:  BP (!) 110/56   Pulse 83   Temp (!) 97.5 F (36.4 C) (Oral)   Resp 16   Ht 5\' 6"  (1.676 m)   Wt 100.2 kg   LMP 06/03/2023   SpO2 96%   BMI 35.67 kg/m  EFM:135bpm/Moderate variability/ 15x15 accels/ None decels CAT: 1 Toco: none   CVE: Dilation: 6 (visually) Exam by:: Dr. Leanora Cover   A&P: 28 y.o. G1P0000 [redacted]w[redacted]d  here for PTL as above  #Labor: Currently quite toco #Pain: IV Q1hr PRN  #FWB: CAT 1 #GBS unknown given GA, PCN ppx, s/p latency abx ampicillin 2g x 2, no amoxicillin, Azithro 1g x 1  #PTL: 3/11 @[redacted]w[redacted]d , EFW 611g, 58%, LR NIPS, alpha thal carrier, Fetal cardiac anomaly previously seen (thought to possibly be small isolated muscular VSD, no [facility] fetal echo done), normal AFI (MVP 3cm), hourglassing membranes funneling into cervix/vagina  - BMX first dose 3/11 @ 1129, plan for second dose on 3/12 @1129 , however can give at 12hr mark if progressing - Indomethacin, Mag for tocolysis  #Alpha thal silent carrier   #Marijuana use during pregnancy: SW consult PP  #Tobacco use during pregnancy: down from 1ppd to 0-1cpd   #Obesity: @[redacted]w[redacted]d , EFW 611g 58%,  Hessie Dibble, MD FMOB Fellow, Faculty practice [facility], Center for [facility] 11/13/23  9:28 PM

## 2023-11-13 NOTE — MAU Provider Note (Signed)
  S [name] is a 28 y.o. G1P0000 pregnant/non-pregnant female at [redacted]w[redacted]d who presents to MAU today with complaint of severe 10/10 abdominal pain that comes and goes for the last 2 hours, with associated vaginal discharge. Initially yellow but now bloody. She feels baby move. She was exposed to COVID yesterday.   Receives care at [facility]. Prenatal records reviewed. 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   Pertinent items noted in HPI and remainder of comprehensive ROS otherwise negative.   O BP 127/74   Pulse 85   Temp 98 F (36.7 C)   Resp 18   Ht 5\' 6"  (1.676 m)   Wt 100.2 kg   LMP 06/03/2023   BMI 35.67 kg/m  Physical Exam No results found for this or any previous visit (from the past 24 hours). CONSTITUTIONAL: Well-developed, well-nourished female in no acute distress. Tearful. HENT:  Normocephalic, atraumatic, External right and left ear normal. Oropharynx is clear and moist EYES: Conjunctivae and EOM are normal.  No scleral icterus.  NECK: Normal range of motion, supple, no masses.  Normal thyroid.  SKIN: Skin is warm and dry. No rash noted. Not diaphoretic. No erythema. No pallor. NEUROLGIC: Alert and oriented to person, place, and time.  CARDIOVASCULAR: Normal heart rate noted RESPIRATORY: Effort and breath sounds normal, no problems with respiration noted. ABDOMEN: Soft, normal bowel sounds, no distention noted.  No tenderness, rebound or guarding.  MUSCULOSKELETAL: Normal range of motion. No tenderness.  No cyanosis, clubbing, or edema.   PELVIC EXAM: Cervix pink, visually 6-7 cm bulging bag of water, without lesion, moderate  bloody discharge, vaginal walls and external genitalia normal.   MDM: MAU Course: Brief yet reassuring EFM Bedside US with unstable fetal lie; transverse head maternal left, to frank breech and back. Sterile speculum exam with bulging amniotic bad and cervix visually 6-7cm, thin. Patient placed in trendelenburg  Contractions every 15+ minutes, irregular  Briefly discussed advance cervical dilation and need to move quickly to labor and delivery, uncertainty of fetal position making mode of delivery unknown at this time. Discussed plan to attempt to maintain pregnancy as long as able and delay labor; to facility magnesium for neuro-protection and betamethasone for lung maturity. Patient very emotional but coping well.   A @DIAGMED @ Medical screening exam complete  P Admit to L&D for expectant management, BMZ, magnesium and tocolysis.  NICU consult placed Admission labs drawn and sent IVF LR bolus started Wet prep, GC swabs collected UA sent   Wyn Forster, MD 11/13/2023 11:22 AM

## 2023-11-13 NOTE — Progress Notes (Signed)
   11/13/23 1600  Spiritual Encounters  Type of Visit Initial  Care provided to: Patient;Friend  Conversation partners present during encounter Nurse  Referral source Patient request;Nurse (RN/NT/LPN)  Reason for visit Urgent spiritual support  OnCall Visit Yes   Chaplain was paged to threatened preterm labor. Pt was worried about delivering her baby in this early stage. She shared that she had not have any stressful event recently, the baby was healthy, but all of a sudden she had bleeding and dilated. She mentioned that preterm birth runs into their family, she and her sister were born early as well. Her friend was at the bedside for support. She said that her mom is on the way. Chaplain listened her feelings reflectively, normalized emotions, reminded that she is not alone here, encouraged her to be calm and patient, provided emotional support, and offered a prayer.  M.Kubra Delano Metz Resident [phone]

## 2023-11-13 NOTE — H&P (Signed)
 OBSTETRIC ADMISSION HISTORY AND PHYSICAL  Pamela Wong is a 28 y.o. female G1P0000 with IUP at [redacted]w[redacted]d by LMP presenting for preterm labor. Presented for vaginal bleeding and increased pelvic pressure. She reports +FMs, No LOF, no blurry vision, headaches or peripheral edema, and RUQ pain.  She plans on breast feeding. She request declines for birth control. She received her prenatal care at  [facility]    Dating: By LMP --->  Estimated Date of Delivery: 03/09/24  Sono:    @[redacted]w[redacted]d , CWD, normal anatomy, cephalic presentation, anterior placenta, 394g, 94% EFW   Prenatal History/Complications: Preterm labor, VSD   Past Medical History: Past Medical History:  Diagnosis Date   Asthma    Depression    Fracture, ankle    rt and left when young   Hidradenitis suppurativa    PID (pelvic inflammatory disease) 03/2023   Trichomonas infection    UTI (urinary tract infection)     Past Surgical History: Past Surgical History:  Procedure Laterality Date   NO PAST SURGERIES      Obstetrical History: OB History     Gravida  1   Para      Term      Preterm      AB  0   Living  0      SAB      IAB      Ectopic  0   Multiple      Live Births  0           Social History Social History   Socioeconomic History   Marital status: Single    Spouse name: Not on file   Number of children: Not on file   Years of education: Not on file   Highest education level: Not on file  Occupational History   Not on file  Tobacco Use   Smoking status: Every Day    Types: Cigars   Smokeless tobacco: Never   Tobacco comments:    Black and milds  Vaping Use   Vaping status: Former   Substances: THC  Substance and Sexual Activity   Alcohol use: Not Currently    Comment: "sometimes" - last was a couple wks ago   Drug use: Yes    Types: Marijuana    Comment: marijuana last 08/13/23; stopped cocaine 08/10/23   Sexual activity: Not Currently    Birth control/protection: None  Other  Topics Concern   Not on file  Social History Narrative   Not on file   Social Drivers of Health   Financial Resource Strain: Not on file  Food Insecurity: No Food Insecurity (11/13/2023)   Hunger Vital Sign    Worried About Running Out of Food in the Last Year: Never true    Ran Out of Food in the Last Year: Never true  Transportation Needs: No Transportation Needs (11/13/2023)   PRAPARE - Administrator, Civil Service (Medical): No    Lack of Transportation (Non-Medical): No  Physical Activity: Not on file  Stress: Not on file  Social Connections: Not on file    Family History: Family History  Problem Relation Age of Onset   Hypertension Mother    Other Father        unknown hx    Allergies: No Known Allergies  Medications Prior to Admission  Medication Sig Dispense Refill Last Dose/Taking   Prenatal MV & Min w/FA-DHA (PRENATAL ADULT GUMMY/DHA/FA PO) Take 1 tablet by mouth daily.   11/12/2023  albuterol (VENTOLIN HFA) 108 (90 Base) MCG/ACT inhaler Inhale 1-2 puffs into the lungs every 6 (six) hours as needed for wheezing or shortness of breath. 18 g 2    aspirin EC 81 MG tablet Take 1 tablet (81 mg total) by mouth daily. Swallow whole. 30 tablet 12    ondansetron (ZOFRAN) 4 MG tablet Take 1 tablet (4 mg total) by mouth every 8 (eight) hours as needed for nausea or vomiting. 30 tablet 2    ondansetron (ZOFRAN-ODT) 8 MG disintegrating tablet Take 1 tablet (8 mg total) by mouth every 8 (eight) hours as needed. 30 tablet 3    oxyCODONE-acetaminophen (PERCOCET/ROXICET) 5-325 MG tablet Take 1 tablet by mouth every 6 (six) hours as needed for severe pain (pain score 7-10). (Patient not taking: Reported on 10/30/2023) 6 tablet 0    promethazine (PHENERGAN) 25 MG tablet Take 1 tablet (25 mg total) by mouth every 6 (six) hours as needed for nausea or vomiting. 30 tablet 3      Review of Systems   All systems reviewed and negative except as stated in HPI  Blood pressure  127/70, pulse 79, temperature 98.2 F (36.8 C), temperature source Oral, resp. rate 18, height 5\' 6"  (1.676 m), weight 100.2 kg, last menstrual period 06/03/2023, SpO2 98%. General appearance: alert, cooperative, and moderate distress Lungs: clear to auscultation bilaterally Heart: regular rate  Abdomen: soft, non-tender; Pelvic: Visibly dilated  Extremities: Homans sign is negative, no sign of DVT Presentation: breech Fetal monitoringVariability: Good {> 6 bpm) HR 145 baseline  Uterine activity: every 8-10 minutes  Dilation: 6 (visually) Exam by:: Dr. Leanora Cover   Prenatal labs: ABO, Rh: --/--/A POS Performed at [facility], [address] [place], [place] [address]  (03/11 1052) Antibody: NEG Performed at [facility], [address], [place], [place] [address]  [id number]) Rubella: 4.28 (12/17 1720) RPR: NON REACTIVE (03/11 1101)  HBsAg: Negative (12/17 1720)  HIV: NON REACTIVE (03/11 1116)  GBS:     No results found for: "GBS" GTT not completed  Genetic screening  Neg x 3 alpha thal carrier  Anatomy US possible VSD   Immunization History  Administered Date(s) Administered   PPD Test 11/29/2020   Tdap 12/12/2016    Prenatal Transfer Tool  Maternal Diabetes: No Genetic Screening: Normal; alph thal carrier Maternal Ultrasounds/Referrals: Fetal Heart Anomalies possible VSD Fetal Ultrasounds or other Referrals:  Fetal echo ordered at [facility]  Maternal Substance Abuse:  Yes:  Type: Smoker, Marijuana Significant Maternal Medications:  None Significant Maternal Lab Results: None Number of Prenatal Visits:greater than 3 verified prenatal visits Maternal Vaccinations:none Other Comments:  None   Results for orders placed or performed during the hospital encounter of 11/13/23 (from the past 24 hours)  Urinalysis, Routine w reflex microscopic -Urine, Clean Catch   Collection Time: 11/13/23 10:50 AM  Result Value Ref Range   Color, Urine YELLOW YELLOW    APPearance HAZY (A) CLEAR   Specific Gravity, Urine 1.012 1.005 - 1.030   pH 8.0 5.0 - 8.0   Glucose, UA NEGATIVE NEGATIVE mg/dL   Hgb urine dipstick LARGE (A) NEGATIVE   Bilirubin Urine NEGATIVE NEGATIVE   Ketones, ur NEGATIVE NEGATIVE mg/dL   Protein, ur NEGATIVE NEGATIVE mg/dL   Nitrite NEGATIVE NEGATIVE   Leukocytes,Ua SMALL (A) NEGATIVE   RBC / HPF >50 0 - 5 RBC/hpf   WBC, UA 11-20 0 - 5 WBC/hpf   Bacteria, UA RARE (A) NONE SEEN   Squamous  Epithelial / HPF 0-5 0 - 5 /HPF   Mucus PRESENT   Wet prep, genital   Collection Time: 11/13/23 10:52 AM   Specimen: Vaginal  Result Value Ref Range   Yeast Wet Prep HPF POC NONE SEEN NONE SEEN   Trich, Wet Prep NONE SEEN NONE SEEN   Clue Cells Wet Prep HPF POC PRESENT (A) NONE SEEN   WBC, Wet Prep HPF POC >=10 (A) <10   Sperm NONE SEEN   Type and screen [facility]   Collection Time: 11/13/23 10:52 AM  Result Value Ref Range   ABO/RH(D)      A POS Performed at [facility], [address] [place], [place] [address]    Antibody Screen      NEG Performed at [facility], [address] [place], [place], [place] [address]    Sample Expiration      11/16/2023,2359 Performed at [facility], [address], [place], [place] [address]   CBC   Collection Time: 11/13/23 11:01 AM  Result Value Ref Range   WBC 12.2 (H) 4.0 - 10.5 K/uL   RBC 3.59 (L) 3.87 - 5.11 MIL/uL   Hemoglobin 11.1 (L) 12.0 - 15.0 g/dL   HCT 91.4 (L) 78.2 - 95.6 %   MCV 92.8 80.0 - 100.0 fL   MCH 30.9 26.0 - 34.0 pg   MCHC 33.3 30.0 - 36.0 g/dL   RDW 21.3 08.6 - 57.8 %   Platelets 189 150 - 400 K/uL   nRBC 0.0 0.0 - 0.2 %  RPR   Collection Time: 11/13/23 11:01 AM  Result Value Ref Range   RPR Ser Ql NON REACTIVE NON REACTIVE  Rapid HIV screen (HIV 1/2 Ab+Ag)   Collection Time: 11/13/23 11:16 AM  Result Value Ref Range   HIV-1 P24 Antigen - HIV24 NON REACTIVE NON REACTIVE   HIV 1/2 Antibodies NON REACTIVE NON  REACTIVE   Interpretation (HIV Ag Ab)      A non reactive test result means that HIV 1 or HIV 2 antibodies and HIV 1 p24 antigen were not detected in the specimen.    Patient Active Problem List   Diagnosis Date Noted   Preterm labor in second trimester 11/13/2023   Anomaly of fetal heart affecting singleton pregnancy, antepartum 10/17/2023   Marijuana use during pregnancy 10/15/2023   Tobacco use affecting pregnancy, antepartum 10/15/2023   Obesity affecting pregnancy, antepartum 10/08/2023   Alpha thalassemia silent carrier 10/08/2023   Encounter for supervision of low-risk pregnancy 08/13/2023    Assessment/Plan:  Pamela Wong is a 28 y.o. G1P0000 at [redacted]w[redacted]d here for preterm labor.   #Labor:Preterm labor  -betamethasone administered for neonatal lung protection -IV ampicillin for GBS prophylaxis -Indomethacin and mag for tocolytic  #Pain: Well controlled  -Fentanyl PRN Q1Hr #FWB: Cat 1  #GBS status:  Unk  #Feeding: Breastmilk  and formula #Reproductive Life planning: None #Circ:  not applicable  Denton Ar, MD  11/13/2023, 2:18 PM

## 2023-11-13 NOTE — Consult Note (Signed)
 NEONATOLOGY PRENATAL CONSULT NOTE   History  I was asked by Dr. Geanie Logan team to consult on this patient for possible preterm delivery.  I met with Pamela Wong and her girlfriend today.  she endorses that this girlfriend and her mother would be her and the baby's primary support people.    Ms Avel Wong is a 28 yo G1 at 23w+2d gestation who presents with a girl fetus. Pregnancy has been complicated by alpha thalassemia silent carrier, marijuana use during pregnancy, tobacco use during pregnancy, and obesity.  Now also complicated by PTL with advanced cervical dilation, presenting today with cervical dilation ~6cm and a bulging bag.  She has been given her first dose of betamethasone and is currently on neuroprotective magensium.  Also receiving penicililn for GBS prophylaxis.      Counseling  As I started my counseling with Pamela Wong, she was surprised by the news that her baby, if delivered at [redacted] weeks gestation, has a high probability of not surviving despite our best efforts to provide all available medical interventions.  We also discussed that if she did survive, there is a high risk of neurodevelopmental disability.    Pamela Wong was very emotional, which I assured her is normal in this scenario, but she declined to discuss any further details of complications that may arise during her daughters NICU stay.  We did NOT discuss significant problems associated with extreme prematurity including respiratory distress syndrome/CLD, IVH/PVL, ROP, NEC, and infection risk.  I was able to discuss that her daughter, should she survive out of the delivery room, would require a very long NICU stay of ~4-5 months.  And I was able to clarify that Pamela Wong does want the NICU team to provide all available medical interventions to attempt to save her child's life, despite the risk of death and developmental disability.  In our conversation, Pamela Wong mentioned having a lot of faith.  I offered a chaplain  visit, which she would like and which her bedside nurse is facilitating.    I emphasized that myself or another neonatologist would be available to answer questions or go into further detail should Pamela Wong wish to discuss further.   Please page our team if desired.   Thank you for involving [place] in the care of this patient. A member of our team will be available should the family have additional questions.  Time for consultation was approximately >30 minutes, of which more than half was face-to-face.    _____________________ Electronically Signed By: Karie Schwalbe, MD, MS Neonatologist

## 2023-11-14 LAB — GC/CHLAMYDIA PROBE AMP (~~LOC~~) NOT AT ARMC
Chlamydia: POSITIVE — AB
Comment: NEGATIVE
Comment: NORMAL
Neisseria Gonorrhea: NEGATIVE

## 2023-11-14 LAB — RESP PANEL BY RT-PCR (RSV, FLU A&B, COVID)  RVPGX2
Influenza A by PCR: NEGATIVE
Influenza B by PCR: NEGATIVE
Resp Syncytial Virus by PCR: NEGATIVE
SARS Coronavirus 2 by RT PCR: NEGATIVE

## 2023-11-14 LAB — RUPTURE OF MEMBRANE (ROM)PLUS: Rom Plus: NEGATIVE

## 2023-11-14 MED ORDER — CALCIUM CARBONATE ANTACID 500 MG PO CHEW
2.0000 | CHEWABLE_TABLET | ORAL | Status: DC | PRN
  Administered 2023-11-16 – 2023-11-18 (×2): 400 mg via ORAL
  Filled 2023-11-14 (×2): qty 2

## 2023-11-14 MED ORDER — SENNA 8.6 MG PO TABS: 2.0000 | ORAL_TABLET | Freq: Every day | ORAL | Status: DC | PRN

## 2023-11-14 MED ORDER — SODIUM CHLORIDE 0.9% FLUSH: 3.0000 mL | INTRAVENOUS | Status: DC | PRN

## 2023-11-14 MED ORDER — POLYETHYLENE GLYCOL 3350 17 G PO PACK
17.0000 g | PACK | Freq: Every day | ORAL | Status: DC | PRN
  Administered 2023-11-18: 17 g via ORAL
  Filled 2023-11-14 (×2): qty 1

## 2023-11-14 MED ORDER — ZOLPIDEM TARTRATE 5 MG PO TABS: 5.0000 mg | ORAL_TABLET | Freq: Every evening | ORAL | Status: DC | PRN

## 2023-11-14 MED ORDER — ACETAMINOPHEN 325 MG PO TABS
650.0000 mg | ORAL_TABLET | ORAL | Status: DC | PRN
  Administered 2023-11-16: 650 mg via ORAL
  Filled 2023-11-14: qty 2

## 2023-11-14 MED ORDER — SENNA 8.6 MG PO TABS
2.0000 | ORAL_TABLET | Freq: Every evening | ORAL | Status: DC | PRN
  Administered 2023-11-14: 17.2 mg via ORAL
  Filled 2023-11-14: qty 2

## 2023-11-14 MED ORDER — PRENATAL MULTIVITAMIN CH
1.0000 | ORAL_TABLET | Freq: Every day | ORAL | Status: DC
  Administered 2023-11-14 – 2023-11-20 (×7): 1 via ORAL
  Filled 2023-11-14 (×7): qty 1

## 2023-11-14 MED ORDER — SODIUM CHLORIDE 0.9% FLUSH
3.0000 mL | Freq: Two times a day (BID) | INTRAVENOUS | Status: DC
  Administered 2023-11-14: 10 mL via INTRAVENOUS
  Administered 2023-11-15: 3 mL via INTRAVENOUS
  Administered 2023-11-15 – 2023-11-16 (×2): 10 mL via INTRAVENOUS
  Administered 2023-11-16: 3 mL via INTRAVENOUS
  Administered 2023-11-17 – 2023-11-18 (×3): 10 mL via INTRAVENOUS
  Administered 2023-11-18: 3 mL via INTRAVENOUS
  Administered 2023-11-19 – 2023-11-20 (×4): 10 mL via INTRAVENOUS

## 2023-11-14 MED ORDER — PRENATAL MULTIVITAMIN CH: 1.0000 | ORAL_TABLET | Freq: Every day | ORAL | Status: DC

## 2023-11-14 MED ORDER — DOCUSATE SODIUM 100 MG PO CAPS
100.0000 mg | ORAL_CAPSULE | Freq: Every day | ORAL | Status: DC
  Administered 2023-11-14 – 2023-11-20 (×7): 100 mg via ORAL
  Filled 2023-11-14 (×7): qty 1

## 2023-11-14 MED ORDER — LACTATED RINGERS IV SOLN: 125.0000 mL/h | INTRAVENOUS | Status: AC

## 2023-11-14 NOTE — Progress Notes (Signed)
 LABOR PROGRESS NOTE  Patient Name: Pamela Wong, female   DOB: 1995/12/31, 28 y.o.  MRN: [id number]  G1P0000 at [redacted]w[redacted]d admitted for PTL  S: Patient sleeping comfortably. Not feeling any contractions.  O:  BP (!) 107/30 (BP Location: Right Arm)   Pulse 77   Temp 97.7 F (36.5 C) (Oral)   Resp 17   Ht 5\' 6"  (1.676 m)   Wt 100.2 kg   LMP 06/03/2023   SpO2 100%   BMI 35.67 kg/m  Reactive monitoring. No contractions on toco   CVE: Dilation: 6 (visually) Exam by:: Dr. Leanora Cover   A&P:   #PTL: No s/sx of progression into labor at this time. Is now s/p 2 doses of BMZ. Will stop mag, PCN, and transfer down to [facility]. Start indocin for continued tocoloysis. Has met with NICU  Joanne Gavel, MD FMOB Fellow, Faculty practice [facility], Center for [facility] 11/14/23  8:48 PM

## 2023-11-15 DIAGNOSIS — Z3A23 23 weeks gestation of pregnancy: Secondary | ICD-10-CM

## 2023-11-15 DIAGNOSIS — O99212 Obesity complicating pregnancy, second trimester: Secondary | ICD-10-CM

## 2023-11-15 DIAGNOSIS — O99322 Drug use complicating pregnancy, second trimester: Secondary | ICD-10-CM

## 2023-11-15 DIAGNOSIS — O99332 Smoking (tobacco) complicating pregnancy, second trimester: Secondary | ICD-10-CM

## 2023-11-15 MED ORDER — NICOTINE 21 MG/24HR TD PT24
21.0000 mg | MEDICATED_PATCH | Freq: Every day | TRANSDERMAL | Status: DC
  Administered 2023-11-15 – 2023-11-20 (×6): 21 mg via TRANSDERMAL
  Filled 2023-11-15 (×6): qty 1

## 2023-11-15 NOTE — Consult Note (Signed)
 MFM Consult Note  Pamela Wong is currently at 23 weeks and 4 days.  She was admitted 2 days ago due to preterm labor with significant cervical dilatation (6 cm dilated visually with a bulging bag).    She was started on magnesium sulfate, placed on a course of Indocin for tocolysis, and given a complete course of antenatal corticosteroids.  Currently she is on bedrest and reports feeling fetal movements.  She denies any leakage of fluid or any further vaginal bleeding and denies any contractions.  This is her first pregnancy.  She denies any prior surgeries to her cervix.    She reports that her mother had 3 preterm births due to spontaneous preterm labor.  Her mother gave birth to her at 30+ weeks and she weighed about 3 pounds.  She had an ultrasound performed 2 days ago that showed an EFW of 611 g (1 pound 6 ounces, 58th percentile).  The fetus was in the breech presentation  The increased risk of a preterm birth, PPROM, and an ascending infection due to significant cervical dilatation was discussed.  The patient was advised that the fact that she remains undelivered today is hopefully a good sign that her pregnancy will continue for a while.  As she is finishing the Indocin course today, she should be started on nifedipine 10 mg every 6 hours for maintenance tocolysis in an attempt to delay delivery for as long as possible.  Due to her advanced cervical dilatation, she should remain in the hospital until she reaches a more optimal gestational age (28 weeks).  The patient was advised that the further we can delay delivery, the better the outcome will be for her baby.  We will continue to follow her closely with you.    While hospitalized she should have an ultrasound performed each week to check the fetal position and the amniotic fluid levels.  A follow-up growth scan should be scheduled in 3 weeks.  The patient stated that she felt better after our discussion today and that all  of her questions were answered.

## 2023-11-15 NOTE — Progress Notes (Signed)
 FACULTY PRACTICE ANTEPARTUM COMPREHENSIVE PROGRESS NOTE  Pamela Wong is a 28 y.o. G1P0000 at [redacted]w[redacted]d who is admitted for threatened preterm labor/advanced cervical dilation.  Estimated Date of Delivery: 03/09/24  Fetal presentation is breech.  Length of Stay:  2 Days. Admitted 11/13/2023  Subjective: Requests to get up and shower. No increased pressure, no contractions, no bleeding, no LOF. RN noted some clear fluid on pad so ROM plus obtained and negative. +FM.  Vitals:  Blood pressure (!) 107/30, pulse 77, temperature 97.7 F (36.5 C), temperature source Oral, resp. rate 17, height 5\' 6"  (1.676 m), weight 100.2 kg, last menstrual period 06/03/2023, SpO2 100%. Physical Examination: CONSTITUTIONAL: Well-developed, well-nourished female in no acute distress. Intermittently tearful CARDIOVASCULAR: Normal heart rate noted RESPIRATORY: Effort normal, no problems with respiration noted ABDOMEN: Soft, nontender, nondistended, gravid. CERVIX: Performed 3/12 around 10:45pm - cervix visually 4-5cm, membranes flush with external os.   Fetal monitoring: FHR: 145 bpm, Variability: moderate, Accelerations: 10x10, Decelerations: Rare variables Uterine activity: flat  Results for orders placed or performed during the hospital encounter of 11/13/23 (from the past 48 hours)  Resp panel by RT-PCR (RSV, Flu A&B, Covid) Anterior Nasal Swab     Status: None   Collection Time: 11/13/23 10:37 AM   Specimen: Anterior Nasal Swab  Result Value Ref Range   SARS Coronavirus 2 by RT PCR NEGATIVE NEGATIVE   Influenza A by PCR NEGATIVE NEGATIVE   Influenza B by PCR NEGATIVE NEGATIVE    Comment: (NOTE) The Xpert Xpress SARS-CoV-2/FLU/RSV plus assay is intended as an aid in the diagnosis of influenza from Nasopharyngeal swab specimens and should not be used as a sole basis for treatment. Nasal washings and aspirates are unacceptable for Xpert Xpress SARS-CoV-2/FLU/RSV testing.  Fact Sheet for  Patients: BloggerCourse.com  Fact Sheet for Healthcare Providers: SeriousBroker.it  This test is not yet approved or cleared by the [place] FDA and has been authorized for detection and/or diagnosis of SARS-CoV-2 by FDA under an Emergency Use Authorization (EUA). This EUA will remain in effect (meaning this test can be used) for the duration of the COVID-19 declaration under Section 564(b)(1) of the Act, 21 U.S.C. section 360bbb-3(b)(1), unless the authorization is terminated or revoked.     Resp Syncytial Virus by PCR NEGATIVE NEGATIVE    Comment: (NOTE) Fact Sheet for Patients: BloggerCourse.com  Fact Sheet for Healthcare Providers: SeriousBroker.it  This test is not yet approved or cleared by the [place] FDA and has been authorized for detection and/or diagnosis of SARS-CoV-2 by FDA under an Emergency Use Authorization (EUA). This EUA will remain in effect (meaning this test can be used) for the duration of the COVID-19 declaration under Section 564(b)(1) of the Act, 21 U.S.C. section 360bbb-3(b)(1), unless the authorization is terminated or revoked.  Performed at [facility], [address], [place], [place] [address]   GC/Chlamydia probe amp ([place])not at [facility]     Status: Abnormal   Collection Time: 11/13/23 10:37 AM  Result Value Ref Range   Neisseria Gonorrhea Negative    Chlamydia Positive (A)    Comment Normal Reference Ranger Chlamydia - Negative    Comment      Normal Reference Range Neisseria Gonorrhea - Negative  Urinalysis, Routine w reflex microscopic -Urine, Clean Catch     Status: Abnormal   Collection Time: 11/13/23 10:50 AM  Result Value Ref Range   Color, Urine YELLOW YELLOW   APPearance HAZY (A) CLEAR   Specific Gravity, Urine 1.012  1.005 - 1.030   pH 8.0 5.0 - 8.0   Glucose, UA NEGATIVE NEGATIVE mg/dL   Hgb urine dipstick  LARGE (A) NEGATIVE   Bilirubin Urine NEGATIVE NEGATIVE   Ketones, ur NEGATIVE NEGATIVE mg/dL   Protein, ur NEGATIVE NEGATIVE mg/dL   Nitrite NEGATIVE NEGATIVE   Leukocytes,Ua SMALL (A) NEGATIVE   RBC / HPF >50 0 - 5 RBC/hpf   WBC, UA 11-20 0 - 5 WBC/hpf   Bacteria, UA RARE (A) NONE SEEN   Squamous Epithelial / HPF 0-5 0 - 5 /HPF   Mucus PRESENT     Comment: Performed at [facility], [address], [place], [place] [address]  Wet prep, genital     Status: Abnormal   Collection Time: 11/13/23 10:52 AM   Specimen: Vaginal  Result Value Ref Range   Yeast Wet Prep HPF POC NONE SEEN NONE SEEN   Trich, Wet Prep NONE SEEN NONE SEEN   Clue Cells Wet Prep HPF POC PRESENT (A) NONE SEEN   WBC, Wet Prep HPF POC >=10 (A) <10   Sperm NONE SEEN     Comment: Performed at [facility], [address] [place], [place] [address]  Type and screen [facility]     Status: None   Collection Time: 11/13/23 10:52 AM  Result Value Ref Range   ABO/RH(D)      A POS Performed at [facility], [address], [place], [place] [address]    Antibody Screen      NEG Performed at [facility], [address] [place], [place], [place] [address]    Sample Expiration      11/16/2023,2359 Performed at [facility], [address] [place], [place] [address]   CBC     Status: Abnormal   Collection Time: 11/13/23 11:01 AM  Result Value Ref Range   WBC 12.2 (H) 4.0 - 10.5 K/uL   RBC 3.59 (L) 3.87 - 5.11 MIL/uL   Hemoglobin 11.1 (L) 12.0 - 15.0 g/dL   HCT 18.8 (L) 41.6 - 60.6 %   MCV 92.8 80.0 - 100.0 fL   MCH 30.9 26.0 - 34.0 pg   MCHC 33.3 30.0 - 36.0 g/dL   RDW 30.1 60.1 - 09.3 %   Platelets 189 150 - 400 K/uL   nRBC 0.0 0.0 - 0.2 %    Comment: Performed at [facility], [address] [place], [place] [address]  RPR     Status: None   Collection Time: 11/13/23 11:01 AM  Result Value Ref Range   RPR Ser Ql NON REACTIVE NON REACTIVE     Comment: Performed at [facility], [address], [place], [place] [address]  Rapid HIV screen (HIV 1/2 Ab+Ag)     Status: None   Collection Time: 11/13/23 11:16 AM  Result Value Ref Range   HIV-1 P24 Antigen - HIV24 NON REACTIVE NON REACTIVE    Comment: (NOTE) Detection of p24 may be inhibited by biotin in the sample, causing false negative results in acute infection.    HIV 1/2 Antibodies NON REACTIVE NON REACTIVE   Interpretation (HIV Ag Ab)      A non reactive test result means that HIV 1 or HIV 2 antibodies and HIV 1 p24 antigen were not detected in the specimen.    Comment: Performed at [facility], [address] [place], [place] [address]  Rupture of Membrane (ROM)  Plus     Status: None   Collection Time: 11/14/23  7:15 PM  Result Value Ref Range   Rom Plus NEGATIVE     Comment: Performed at [facility], [address] [place], [place] [address]    [place] MFM OB FOLLOW UP Result Date: 11/13/2023 ----------------------------------------------------------------------  OBSTETRICS REPORT                       (Signed Final 11/13/2023 02:53 pm) ---------------------------------------------------------------------- Patient Info  ID #:       [id number]                          D.O.B.:  10/16/1995 (27 yrs)(F)  Name:       Pamela Wong              Visit Date: 11/13/2023 11:41 am ---------------------------------------------------------------------- Performed By  Attending:        Ma Rings MD         Ref. Address:     [address]                                                             [place], [place]                                                             [address]  Performed By:     Percell Boston          Secondary Phy.:   [facility]                    RDMS                                                             Suites  Referred By:      [facility]          Location:         [facility]                    for [facility]                                 [facility] ---------------------------------------------------------------------- Orders  #  Description                           Code        Ordered By  1  [place] MFM OB FOLLOW UP                   82956.21    Marcheta Grammes ----------------------------------------------------------------------  #  Order #  Accession #                Episode #  1  [id number]                   [id number]                 [id number] ---------------------------------------------------------------------- Indications  Preterm labor                                  O60.10X0  Tobacco use complicating pregnancy,            O99.332  second trimester (Black and Mild 1x daily)  [redacted] weeks gestation of pregnancy                Z3A.23  Encounter for antenatal screening,             Z36.9  unspecified  Drug use complicating pregnancy, 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 Cord Insertion:      Previously seen  Amniotic Fluid  AFI FV:      Within normal limits                              Largest Pocket(cm)                              3 ---------------------------------------------------------------------- Biometry  BPD:      55.7  mm     G. Age:  23w 0d         34  %    CI:        71.53   %    70 - 86                                                          FL/HC:      18.1   %    19.2 - 20.8  HC:      209.7  mm     G. Age:  23w 0d         25  %    HC/AC:      1.03        1.05 - 1.21  AC:       203    mm  G. Age:  24w 6d         87  %    FL/BPD:     68.0   %    71 - 87  FL:       37.9  mm     G. Age:  22w 1d          9  %    FL/AC:      18.7   %    20 - 24  Est. FW:     611  gm      1 lb 6 oz     58  % ---------------------------------------------------------------------- OB History  Blood Type:   A+  Gravidity:    1 ---------------------------------------------------------------------- Gestational Age  LMP:           23w 2d        Date:  06/03/23                  EDD:   03/09/24  U/S Today:     23w 2d                                        EDD:   03/09/24  Best:          23w 2d     Det. By:  LMP  (06/03/23)          EDD:   03/09/24 ---------------------------------------------------------------------- Targeted Anatomy  Central Nervous System  Calvarium/Cranial V.:  Appears normal         Cereb./Vermis:          Previously seen  Cavum:                 Appears normal         Cisterna Magna:         Previously seen  Lateral Ventricles:    Appears normal         Midline Falx:           Appears normal  Choroid Plexus:        Previously seen  Spine  Cervical:              Previously seen        Sacral:                 Previously seen  Thoracic:              Previously seen        Shape/Curvature:        Previously seen  Lumbar:                Previously seen  Head/Neck  Lips:                  Previously seen        Profile:                Previously seen  Neck:                  Previously seen        Orbits/Eyes:            Previously seen  Nuchal Fold:           Previously seen        Mandible:  Previously seen  Nasal Bone:            Previously seen        Maxilla:                Previously seen  Thorax  4 Chamber View:        Appears normal         SVC:                    Not well visualized  Cardiac Activity:      Observed               Interventr. Septum:     Abn-VSD  Cardiac Rhythm:        Normal                 Cardiac Axis:           Normal  Cardiac Situs:         Appears normal         Diaphragm:               Appears normal  Rt Outflow Tract:      Previously seen        3 Vessel View:          Previously seen  Lt Outflow Tract:      Previously seen        3 V Trachea View:       Previously seen  Aortic Arch:           Not well visualized    IVC:                    Not well visualized  Ductal Arch:           Not well visualized    Crossing:               Previously seen  Abdomen  Ventral Wall:          Previously seen        Lt Kidney:              Appears normal  Cord Insertion:        Previously seen        Rt Kidney:              Appears normal  Situs:                 Appears normal         Bladder:                Appears normal  Stomach:               Appears normal  Extremities  Lt Humerus:            Previously seen        Lt Femur:               Previously seen  Rt Humerus:            Previously seen        Rt Femur:               Previously seen  Lt Forearm:            Previously seen        Lt Lower Leg:           Previously seen  Rt Forearm:            Previously seen        Rt Lower Leg:           Previously seen  Lt Hand:               Not well visualized    Lt Foot:                Previously seen  Rt Hand:               Not well visualized    Rt Foot:                Previously seen  Other  Umbilical Cord:        Normal 3-vessel        Genitalia:              Female-nml ---------------------------------------------------------------------- Cervix Uterus Adnexa  Cervix  Hourglass membranes into the vagina  Uterus  No abnormality visualized.  Right Ovary  Not visualized.  Left Ovary  Not visualized.  Cul De Sac  No free fluid seen.  Adnexa  No abnormality visualized ---------------------------------------------------------------------- Comments  This patient was admitted due to preterm labor.  The overall EFW obtained today was 1 pound 6 ounces (611  g, 58th percentile).  There was normal amniotic fluid noted with a maximal vertical  pocket of 3 cm.  Hourglassing membranes were noted to funnel into the   cervix/vagina. ----------------------------------------------------------------------                   Ma Rings, MD Electronically Signed Final Report   11/13/2023 02:53 pm ----------------------------------------------------------------------    Current scheduled medications  docusate sodium  100 mg Oral Daily   indomethacin  25 mg Oral Q6H   nicotine  7 mg Transdermal Daily   prenatal multivitamin  1 tablet Oral Q1200   sodium chloride flush  3-10 mL Intravenous Q12H    I have reviewed the patient's current medications.  ASSESSMENT: Principal Problem:   Preterm labor in second trimester  We started to revisit the discussion about patient's goals for herself and her baby. Specifically started to talk about morbidity of classical cesarean delivery and implications for future pregnancies. Discussed that some women may opt AGAINST cesarean delivery for malpresentation at this gestational age since we are in a bit of a gray zone in terms of balancing risks/benefits for mom and baby. She is unsure of what she desires but would like to hold off on further discussion until later today. May be interested in meeting with NICU again to better understand her clinical picture.    PLAN: S/p BMZ & mag 3/11-12 Indocin to end today S/p NICU consult - consider re-engaging as patient is more open to hearing about course for infant if delivery occurs in near future Will discuss returning to activity as tolerated given stable exam last night BID NSTs Growth @ 23/2 611g (58%), AC 87%, BREECH  Continue routine antenatal care.  Harvie Bridge, MD Obstetrician & Gynecologist, [facility] for [employer], [facility]

## 2023-11-15 NOTE — Progress Notes (Signed)
 Subjective: Pt seen this morning.  She remained stable and reported fetal movement with no loss of fluid or vaginal bleeding.  She denied any uterine contractions or pelvic pain. Discussed probable obstetric course for now.    MFM will also see patient today to discuss.  Objective: Vital signs in last 24 hours: Temp:  [97.7 F (36.5 C)-98.4 F (36.9 C)] 98.4 F (36.9 C) (03/13 1140) Pulse Rate:  [70-98] 98 (03/13 1140) Resp:  [16-17] 16 (03/13 1140) BP: (97-128)/(30-65) 125/65 (03/13 1140) SpO2:  [98 %-100 %] 100 % (03/13 1141) Weight change:   Intake/Output from previous day: 03/12 0701 - 03/13 0700 In: 843.2 [P.O.:120; I.V.:623.2; IV Piggyback:100] Out: 3550 [Urine:3550] Intake/Output this shift: Total I/O In: -  Out: 250 [Urine:250]  General appearance: alert, cooperative, and no distress Head: Normocephalic, without obvious abnormality, atraumatic Resp: clear to auscultation bilaterally Cardio: regular rate and rhythm  Lab Results: Recent Labs    11/13/23 1101  WBC 12.2*  HGB 11.1*  HCT 33.3*  PLT 189    I have reviewed the patient's current medications.  Assessment/Plan: Advanced cervical dilation/Preterm labor No active contractions for now. S/p BMZ x 2 and magnesium sulfate  NICU to discuss fetal outcomes as patient has had time to absorb deal with her emotions.   Possibly start po procardia, BP permitting, for continued maintenance. MFM to discuss possible return to home, likely 28 weeks or later.   LOS: 2 days   Warden Fillers 11/15/2023,2:22 PM

## 2023-11-15 NOTE — Consult Note (Signed)
 NEONATOLOGY PRENATAL CONSULT NOTE Delayed entry: patient seen around 1500 on 11/15/23   This is a follow-up consult from the original consult from 3/11.  Please see patient's pertinent history from prior note from that date.   I was asked by Dr. Donavan Foil to revisit with Ms. Steinkamp regarding the possible outcomes and complications that are possible if she is to delivery her baby at 23-[redacted] weeks gestation. While her cervical exam has not changed and she does not have any current labor symptoms, early delivery is still very likely.     We were able to discuss in more detail the poor overall survival rate at this gestational age, and the even lower survival rate without significant neurologic impairment.  We discussed the expected hospital length of stay and the significant problems associated with extreme prematurity including respiratory distress syndrome/CLD, IVH/PVL, ROP, NEC, and infection risk. I explained that should they choose critical care interventions, the infant would be intubated immediately in the delivery room.  She understands that despite maximum critical care interventions, her baby may encounter complications that  she may not be able to survive.  She also understands that if she does survive, she may have life-long medical complexities such as blindness, cerebral palsy, and significant developmental delay.   I again explained that given the above information, there is joint medical decision making for infant's born <[redacted] weeks gestation and that not all families elect to provide intensive care measures to sustain life.  Ms. Hashimi quickly answered that she wants the NICU team to do everything possible to sustain life.  Ms. Langsam was also able to clarify that there is no father or partner who would participate in joint decision making.    Thank you for involving [place] in the care of this patient.  A member of our team will be available should the family have additional questions.  Time for  consultation was approximately >40 minutes, of which more than half was face-to-face.    _____________________ Electronically Signed By: Karie Schwalbe, MD, MS Neonatologist

## 2023-11-16 ENCOUNTER — Other Ambulatory Visit: Payer: Self-pay

## 2023-11-16 DIAGNOSIS — A5602 Chlamydial vulvovaginitis: Secondary | ICD-10-CM | POA: Diagnosis present

## 2023-11-16 MED ORDER — AZITHROMYCIN 250 MG PO TABS
1000.0000 mg | ORAL_TABLET | Freq: Once | ORAL | Status: AC
  Administered 2023-11-16: 1000 mg via ORAL
  Filled 2023-11-16: qty 4

## 2023-11-16 MED ORDER — HYDROCODONE-ACETAMINOPHEN 5-325 MG PO TABS
1.0000 | ORAL_TABLET | Freq: Four times a day (QID) | ORAL | Status: DC | PRN
  Administered 2023-11-18 – 2023-11-20 (×4): 1 via ORAL
  Filled 2023-11-16 (×4): qty 1

## 2023-11-16 NOTE — Progress Notes (Signed)
 FACULTY PRACTICE ANTEPARTUM COMPREHENSIVE PROGRESS NOTE  MONZERAT HANDLER is a 28 y.o. G1P0000 at [redacted]w[redacted]d who is admitted for threatened preterm labor.  Estimated Date of Delivery: 03/09/24 Fetal presentation is breech(variable).  Length of Stay:  3 Days. Admitted 11/13/2023  Subjective: Doing well this morning with no complaints. Appreciated meeting with NICU & MFM yesterday.  Denies contractions, pelvic pressure, LOF. Reports light pink with wiping but no bleeding. Just woke up so hasn't noticed baby move but felt baby moving before she fell asleep  Vitals:  Blood pressure (!) 104/54, pulse 75, temperature 98.2 F (36.8 C), temperature source Oral, resp. rate 16, height 5\' 6"  (1.676 m), weight 100.2 kg, last menstrual period 06/03/2023, SpO2 100%. Physical Examination: CONSTITUTIONAL: Well-developed, well-nourished female in no acute distress.  CARDIOVASCULAR: Normal heart rate noted RESPIRATORY: Effort normal, no problems with respiration noted ABDOMEN: Soft, nontender, nondistended, gravid. CERVIX: Dilation: 6 (visually) Exam by:: Dr. Leanora Cover  Fetal monitoring: FHR: 150 bpm, Variability: moderate, Accelerations: Absent, Decelerations: Absent  Uterine activity: flat  Results for orders placed or performed during the hospital encounter of 11/13/23 (from the past 48 hours)  Rupture of Membrane (ROM) Plus     Status: None   Collection Time: 11/14/23  7:15 PM  Result Value Ref Range   Rom Plus NEGATIVE     Comment: Performed at [facility], [address] [place], [place] [address]    No results found.  Current scheduled medications  docusate sodium  100 mg Oral Daily   nicotine  21 mg Transdermal Daily   prenatal multivitamin  1 tablet Oral Q1200   sodium chloride flush  3-10 mL Intravenous Q12H    I have reviewed the patient's current medications.  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

## 2023-11-17 ENCOUNTER — Encounter (HOSPITAL_COMMUNITY): Payer: Self-pay | Admitting: Obstetrics & Gynecology

## 2023-11-17 MED ORDER — LACTATED RINGERS IV BOLUS
500.0000 mL | Freq: Once | INTRAVENOUS | Status: AC
  Administered 2023-11-17: 500 mL via INTRAVENOUS

## 2023-11-17 NOTE — Progress Notes (Signed)
 Patient complains of bleeding and a half-dollar sized clot with bright red bleeding noted in toilet. No active bleeding continuing after return to bed. Dr. Earlene Plater notified and EFM monitors placed.

## 2023-11-17 NOTE — Progress Notes (Signed)
 Patient sitting up eating from 1745-1802. Unable to trace FHT during this time.

## 2023-11-17 NOTE — Plan of Care (Signed)

## 2023-11-17 NOTE — Progress Notes (Signed)
 Patient ID: Pamela Wong, female   DOB: 1996/05/04, 28 y.o.   MRN: [id number] FACULTY PRACTICE ANTEPARTUM(COMPREHENSIVE) NOTE  Pamela Wong is a 28 y.o. G1P0000 with Estimated Date of Delivery: 03/09/24   By  early ultrasound [redacted]w[redacted]d  who is admitted for advanced cervical cahnge with contractions.    Fetal presentation is breech. Length of Stay:  4  Days  Date of admission:11/13/2023  Subjective: No complaints Patient reports the fetal movement as active. Patient reports uterine contraction  activity as rare. Patient reports  vaginal bleeding as scant staining. Patient describes fluid per vagina as None.  Vitals:  Blood pressure (!) 114/54, pulse 84, temperature 98.4 F (36.9 C), temperature source Oral, resp. rate 17, height 5\' 6"  (1.676 m), weight 100.2 kg, last menstrual period 06/03/2023, SpO2 98%. Vitals:   11/16/23 1945 11/16/23 2341 11/16/23 2343 11/17/23 0458  BP: 121/64 (!) 111/44 (!) 111/55 (!) 114/54  Pulse: 97 79 83 84  Resp: 16  16 17   Temp: 98.7 F (37.1 C)  97.8 F (36.6 C) 98.4 F (36.9 C)  TempSrc: Oral  Oral Oral  SpO2: 98% 96% 96% 98%  Weight:      Height:       Physical Examination:  General appearance - alert, well appearing, and in no distress Abdomen - soft, nontender, nondistended, no masses or organomegaly Fundal Height:  size equals dates Pelvic Exam:  examination not indicated Cervical Exam: Not evaluated.  Extremities: extremities normal, atraumatic, no cyanosis or edema with DTRs 2+ bilaterally Membranes:intact  Fetal Monitoring:  Baseline: 150 bpm, Variability: Fair (1-6 bpm), Accelerations: Non-reactive but appropriate for gestational age, and Decelerations: Absent   appropriate  Labs:  No results found for this or any previous visit (from the past 24 hours).  Imaging Studies:    [place] MFM OB FOLLOW UP Result Date: 11/13/2023 ----------------------------------------------------------------------  OBSTETRICS REPORT                        (Signed Final 11/13/2023 02:53 pm) ---------------------------------------------------------------------- Patient Info  ID #:       [id number]                          D.O.B.:  February 20, 1996 (28 yrs)(F)  Name:       Pamela Wong              Visit Date: 11/13/2023 11:41 am ---------------------------------------------------------------------- Performed By  Attending:        Ma Rings MD         Ref. Address:     [address]                                                             [place], [place]                                                             [address]  Performed By:     Percell Boston          Secondary Phy.:   [facility]  Birthing                    RDMS                                                             Suites  Referred By:      [facility]          [facility] ---------------------------------------------------------------------- Orders  #  Description                           Code        Ordered By  1  [place] MFM OB FOLLOW UP                   [phone]    Marcheta Grammes ----------------------------------------------------------------------  #  Order #                     Accession #                Episode #  1  [id number]                   [id number]                 [id number] ---------------------------------------------------------------------- Indications  Preterm labor                                  O60.10X0  Tobacco use complicating pregnancy,            O99.332  second trimester (Black and Mild 1x daily)  [redacted] weeks gestation of pregnancy                Z3A.23  Encounter for antenatal screening,             Z36.9  unspecified  Drug use complicating pregnancy, second        O99.322  trimester (marijuana use 2x daily)  Obesity complicating pregnancy, second         O99.212  trimester (BMI 31)  Fetal or maternal indication (PID)             O35.8XX0  Low risk Nips  Genetic carrier Scientist, research (medical))                    Z14.8  Fetal cardiac anomaly affecting pregnancy,     O35.8XX0  antepartum ---------------------------------------------------------------------- Fetal Evaluation  Num Of Fetuses:         1  Fetal Heart Rate(bpm):  130  Cardiac Activity:       Observed  Presentation:           Breech  Placenta:               Anterior  P. Cord Insertion:  Previously seen  Amniotic Fluid  AFI FV:      Within normal limits                              Largest Pocket(cm)                              3 ---------------------------------------------------------------------- Biometry  BPD:      55.7  mm     G. Age:  23w 0d         34  %    CI:        71.53   %    70 - 86                                                          FL/HC:      18.1   %    19.2 - 20.8  HC:      209.7  mm     G. Age:  23w 0d         25  %    HC/AC:      1.03        1.05 - 1.21  AC:       203   mm     G. Age:  24w 6d         87  %    FL/BPD:     68.0   %    71 - 87  FL:       37.9  mm     G. Age:  22w 1d          9  %    FL/AC:      18.7   %    20 - 24  Est. FW:     611  gm      1 lb 6 oz     58  % ---------------------------------------------------------------------- OB History  Blood Type:   A+  Gravidity:    1 ---------------------------------------------------------------------- Gestational Age  LMP:           23w 2d        Date:  06/03/23                  EDD:   03/09/24  U/S Today:     23w 2d                                        EDD:   03/09/24  Best:          23w 2d     Det. By:  LMP  (06/03/23)          EDD:   03/09/24 ---------------------------------------------------------------------- Targeted Anatomy  Central Nervous System  Calvarium/Cranial V.:  Appears normal         Cereb./Vermis:          Previously seen  Cavum:                 Appears normal         Cisterna Magna:  Previously seen  Lateral Ventricles:    Appears normal         Midline Falx:           Appears normal  Choroid Plexus:        Previously seen  Spine  Cervical:               Previously seen        Sacral:                 Previously seen  Thoracic:              Previously seen        Shape/Curvature:        Previously seen  Lumbar:                Previously seen  Head/Neck  Lips:                  Previously seen        Profile:                Previously seen  Neck:                  Previously seen        Orbits/Eyes:            Previously seen  Nuchal Fold:           Previously seen        Mandible:               Previously seen  Nasal Bone:            Previously seen        Maxilla:                Previously seen  Thorax  4 Chamber View:        Appears normal         SVC:                    Not well visualized  Cardiac Activity:      Observed               Interventr. Septum:     Abn-VSD  Cardiac Rhythm:        Normal                 Cardiac Axis:           Normal  Cardiac Situs:         Appears normal         Diaphragm:              Appears normal  Rt Outflow Tract:      Previously seen        3 Vessel View:          Previously seen  Lt Outflow Tract:      Previously seen        3 V Trachea View:       Previously seen  Aortic Arch:           Not well visualized    IVC:                    Not well visualized  Ductal Arch:           Not well visualized    Crossing:  Previously seen  Abdomen  Ventral Wall:          Previously seen        Lt Kidney:              Appears normal  Cord Insertion:        Previously seen        Rt Kidney:              Appears normal  Situs:                 Appears normal         Bladder:                Appears normal  Stomach:               Appears normal  Extremities  Lt Humerus:            Previously seen        Lt Femur:               Previously seen  Rt Humerus:            Previously seen        Rt Femur:               Previously seen  Lt Forearm:            Previously seen        Lt Lower Leg:           Previously seen  Rt Forearm:            Previously seen        Rt Lower Leg:           Previously seen  Lt Hand:               Not well  visualized    Lt Foot:                Previously seen  Rt Hand:               Not well visualized    Rt Foot:                Previously seen  Other  Umbilical Cord:        Normal 3-vessel        Genitalia:              Female-nml ---------------------------------------------------------------------- Cervix Uterus Adnexa  Cervix  Hourglass membranes into the vagina  Uterus  No abnormality visualized.  Right Ovary  Not visualized.  Left Ovary  Not visualized.  Cul De Sac  No free fluid seen.  Adnexa  No abnormality visualized ---------------------------------------------------------------------- Comments  This patient was admitted due to preterm labor.  The overall EFW obtained today was 1 pound 6 ounces (611  g, 58th percentile).  There was normal amniotic fluid noted with a maximal vertical  pocket of 3 cm.  Hourglassing membranes were noted to funnel into the  cervix/vagina. ----------------------------------------------------------------------                   Ma Rings, MD Electronically Signed Final Report   11/13/2023 02:53 pm ----------------------------------------------------------------------     Medications:  Scheduled  docusate sodium  100 mg Oral Daily   nicotine  21 mg Transdermal Daily   prenatal multivitamin  1 tablet Oral Q1200   sodium chloride flush  3-10 mL Intravenous Q12H   I have  reviewed the patient's current medications.  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

## 2023-11-18 MED ORDER — CYCLOBENZAPRINE HCL 10 MG PO TABS
10.0000 mg | ORAL_TABLET | Freq: Two times a day (BID) | ORAL | Status: DC | PRN
  Administered 2023-11-19 – 2023-11-20 (×3): 10 mg via ORAL
  Filled 2023-11-18 (×3): qty 1

## 2023-11-18 MED ORDER — BISACODYL 10 MG RE SUPP
10.0000 mg | Freq: Once | RECTAL | Status: AC
  Administered 2023-11-18: 10 mg via RECTAL
  Filled 2023-11-18: qty 1

## 2023-11-18 NOTE — Progress Notes (Signed)
 Patient ID: AFRIKA BRICK, female   DOB: [birth year], 28 y.o.   MRN: [id number] FACULTY PRACTICE ANTEPARTUM(COMPREHENSIVE) NOTE  Pamela Wong is a 27 y.o. G1P0000 at  [redacted]w[redacted]d who is admitted for advanced cervical change with contractions.    Fetal presentation is breech.  Length of Stay:  5  Days  Date of admission:11/13/2023  Subjective: No complaints Patient reports the fetal movement as active. Patient reports uterine contraction  activity as rare. Patient reports  vaginal bleeding as scant staining. Patient describes fluid per vagina as none.  Vitals:  Blood pressure (!) 100/44, pulse 72, temperature 98.5 F (36.9 C), temperature source Oral, resp. rate 17, height 5\' 6"  (1.676 m), weight 100.2 kg, last menstrual period 06/03/2023, SpO2 98%. Vitals:   11/17/23 1645 11/17/23 2008 11/17/23 2343 11/18/23 0527  BP: 110/62 (!) 102/52 (!) 109/56 (!) 100/44  Pulse: 97 74 65 72  Resp: 15 17 16 17   Temp: 98.4 F (36.9 C) 98.3 F (36.8 C) 98 F (36.7 C) 98.5 F (36.9 C)  TempSrc: Oral Oral Oral Oral  SpO2: 97% 100% 100% 98%  Weight:      Height:       Physical Examination:  General appearance - alert, well appearing, and in no distress Abdomen - soft, nontender, nondistended, no masses or organomegaly Fundal Height:  size equals dates Pelvic Exam:  examination not indicated Cervical Exam: Not evaluated.  Extremities: extremities normal, atraumatic, no cyanosis or edema with DTRs 2+ bilaterally Membranes:intact  Fetal Monitoring:  Baseline: 150 bpm, Variability: Good (1-6 bpm), Accelerations: Non-reactive but appropriate for gestational age, and Decelerations: short variable decels  Labs:  No results found for this or any previous visit (from the past 24 hours).  Imaging Studies:    No results found.    Medications:  Scheduled  docusate sodium  100 mg Oral Daily   nicotine  21 mg Transdermal Daily   prenatal multivitamin  1 tablet Oral Q1200   sodium chloride flush   3-10 mL Intravenous Q12H   I have reviewed the patient's current medications.  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

## 2023-11-18 NOTE — Plan of Care (Signed)

## 2023-11-19 ENCOUNTER — Ambulatory Visit: Payer: Medicaid Other

## 2023-11-19 DIAGNOSIS — Z3A24 24 weeks gestation of pregnancy: Secondary | ICD-10-CM

## 2023-11-19 NOTE — Progress Notes (Signed)
 Patient ID: Pamela Wong, female   DOB: 07/03/1996, 28 y.o.   MRN: [id number] FACULTY PRACTICE ANTEPARTUM(COMPREHENSIVE) NOTE  Pamela Wong is a 28 y.o. G1P0000 with Estimated Date of Delivery: 03/09/24   By  early ultrasound [redacted]w[redacted]d  who is admitted for advanced cervical change, had contractions but presentation exam concerning for incompetence.    Fetal presentation is breech. Length of Stay:  6  Days  Date of admission:11/13/2023  Subjective: No complaints except low back pain Patient reports the fetal movement as active. Patient reports uterine contraction  activity as none. Patient reports  vaginal bleeding as none. Patient describes fluid per vagina as None.  Vitals:  Blood pressure (!) 109/50, pulse (!) 105, temperature 98.3 F (36.8 C), temperature source Oral, resp. rate 16, height 5\' 6"  (1.676 m), weight 100.2 kg, last menstrual period 06/03/2023, SpO2 96%. Vitals:   11/18/23 1709 11/18/23 2001 11/19/23 0053 11/19/23 0506  BP: 119/61 (!) 109/55 (!) 115/51 (!) 109/50  Pulse: 99 88 90 (!) 105  Resp: 18 19 18 16   Temp: 98.1 F (36.7 C) 98 F (36.7 C) 98.5 F (36.9 C) 98.3 F (36.8 C)  TempSrc:  Oral Oral Oral  SpO2: 100% 99% 99% 96%  Weight:      Height:       Physical Examination:  General appearance - alert, well appearing, and in no distress Abdomen - soft, nontender, nondistended, no masses or organomegaly Fundal Height:  size equals dates Pelvic Exam:  examination not indicated Cervical Exam: Not evaluated.  Extremities: extremities normal, atraumatic, no cyanosis or edema with DTRs 2+ bilaterally Membranes:intact  Fetal Monitoring:  Baseline: 140 bpm, Variability: Fair (1-6 bpm), Accelerations: Non-reactive but appropriate for gestational age, and Decelerations: Absent   appropriate  Labs:  No results found for this or any previous visit (from the past 24 hours).  Imaging Studies:    No results found.   Medications:  Scheduled  docusate sodium  100  mg Oral Daily   nicotine  21 mg Transdermal Daily   prenatal multivitamin  1 tablet Oral Q1200   sodium chloride flush  3-10 mL Intravenous Q12H   I have reviewed the patient's current medications.  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

## 2023-11-20 ENCOUNTER — Inpatient Hospital Stay (HOSPITAL_COMMUNITY)

## 2023-11-20 DIAGNOSIS — O4692 Antepartum hemorrhage, unspecified, second trimester: Secondary | ICD-10-CM | POA: Diagnosis not present

## 2023-11-20 DIAGNOSIS — Z3A24 24 weeks gestation of pregnancy: Secondary | ICD-10-CM | POA: Diagnosis not present

## 2023-11-20 LAB — CBC
HCT: 35 % — ABNORMAL LOW (ref 36.0–46.0)
Hemoglobin: 11.5 g/dL — ABNORMAL LOW (ref 12.0–15.0)
MCH: 31.1 pg (ref 26.0–34.0)
MCHC: 32.9 g/dL (ref 30.0–36.0)
MCV: 94.6 fL (ref 80.0–100.0)
Platelets: 167 10*3/uL (ref 150–400)
RBC: 3.7 MIL/uL — ABNORMAL LOW (ref 3.87–5.11)
RDW: 13.2 % (ref 11.5–15.5)
WBC: 13.9 10*3/uL — ABNORMAL HIGH (ref 4.0–10.5)
nRBC: 0 % (ref 0.0–0.2)

## 2023-11-20 LAB — TYPE AND SCREEN
ABO/RH(D): A POS
Antibody Screen: NEGATIVE

## 2023-11-20 MED ORDER — LACTATED RINGERS IV BOLUS
500.0000 mL | Freq: Once | INTRAVENOUS | Status: AC
  Administered 2023-11-20: 500 mL via INTRAVENOUS

## 2023-11-20 NOTE — Progress Notes (Signed)
 FACULTY PRACTICE ANTEPARTUM PROGRESS NOTE  Pamela Wong is a 28 y.o. G1P0000 at [redacted]w[redacted]d who is admitted for advanced cervical dilation.  Estimated Date of Delivery: 03/09/24 Fetal presentation is breech.  Length of Stay:  7 Days. Admitted 11/13/2023  Subjective: Pt seen this AM.  She noted some lower pelvic pain, but adamantly denied contractions.  After I left the room, nurse called back and said the patient had some bleeding with clots.  Returned and patient was comfortable, again denying abdominal pain or contractions. SSE performed, cervix looked FT-1 cm, no obvious membranes seen.  Moderate amount of bloody old appearing mucus seen in the cervical os.  No active bleeding.    No obvious sign or suspicion for ROM. Patient reports normal fetal movement.  She denies uterine contractions.  Vitals:  Blood pressure (!) 98/47, pulse 85, temperature 98.3 F (36.8 C), temperature source Oral, resp. rate 18, height 5\' 6"  (1.676 m), weight 100.2 kg, last menstrual period 06/03/2023, SpO2 100%. Physical Examination: CONSTITUTIONAL: Well-developed, well-nourished female in no acute distress.  HENT:  Normocephalic, atraumatic, External right and left ear normal. Oropharynx is clear and moist EYES: Conjunctivae and EOM are normal.  NECK: Normal range of motion, supple, no masses. SKIN: Skin is warm and dry. No rash noted. Not diaphoretic. No erythema. No pallor. NEUROLGIC: Alert and oriented to person, place, and time. Normal reflexes, muscle tone coordination. No cranial nerve deficit noted. PSYCHIATRIC: Normal mood and affect. Normal behavior. Normal judgment and thought content. CARDIOVASCULAR: Normal heart rate noted, regular rhythm RESPIRATORY: Effort and breath sounds normal, no problems with respiration noted MUSCULOSKELETAL: Normal range of motion. No edema and no tenderness. ABDOMEN: Soft, nontender, nondistended, gravid. CERVIX: see above  Fetal monitoring: FHR: 140s bpm, Variability:  moderate, appropriate to 24 weeks, Accelerations: Present, Decelerations: rare variable noted, overall strip is consistent with [redacted] week gestation Uterine activity: none   No results found for this or any previous visit (from the past 48 hours).  I have reviewed the patient's current medications.  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  Unsure of etiology of vaginal bleeding.  Do not think patient is abrupting as there are no contractions and the abdomen is soft/nontender.  If there is any further bleeding will get limited ultrasound to check on baby and placenta. Visually the cervix is not 4-5 cm as previous. Continue daily surveillance. Pt has questions about getting echo for VSD, will check with MFM   Continue routine antenatal care.   Mariel Aloe, MD [facility] Faculty Attending, Center for [facility] 11/20/2023 9:23 AM

## 2023-11-21 ENCOUNTER — Inpatient Hospital Stay (HOSPITAL_COMMUNITY): Admitting: Anesthesiology

## 2023-11-21 ENCOUNTER — Encounter (HOSPITAL_COMMUNITY)
Admission: AD | Disposition: A | Discharge status: Home / Self Care | Payer: Self-pay | Source: Home / Self Care | Attending: Family Medicine

## 2023-11-21 ENCOUNTER — Encounter (HOSPITAL_COMMUNITY): Payer: Self-pay | Admitting: Obstetrics and Gynecology

## 2023-11-21 ENCOUNTER — Other Ambulatory Visit: Payer: Self-pay

## 2023-11-21 DIAGNOSIS — Z3A24 24 weeks gestation of pregnancy: Secondary | ICD-10-CM

## 2023-11-21 DIAGNOSIS — O99214 Obesity complicating childbirth: Secondary | ICD-10-CM

## 2023-11-21 DIAGNOSIS — O321XX0 Maternal care for breech presentation, not applicable or unspecified: Secondary | ICD-10-CM

## 2023-11-21 DIAGNOSIS — O99324 Drug use complicating childbirth: Secondary | ICD-10-CM

## 2023-11-21 DIAGNOSIS — O99334 Smoking (tobacco) complicating childbirth: Secondary | ICD-10-CM

## 2023-11-21 DIAGNOSIS — Z98891 History of uterine scar from previous surgery: Secondary | ICD-10-CM

## 2023-11-21 LAB — CBC
HCT: 32 % — ABNORMAL LOW (ref 36.0–46.0)
Hemoglobin: 10.6 g/dL — ABNORMAL LOW (ref 12.0–15.0)
MCH: 31 pg (ref 26.0–34.0)
MCHC: 33.1 g/dL (ref 30.0–36.0)
MCV: 93.6 fL (ref 80.0–100.0)
Platelets: 158 10*3/uL (ref 150–400)
RBC: 3.42 MIL/uL — ABNORMAL LOW (ref 3.87–5.11)
RDW: 13 % (ref 11.5–15.5)
WBC: 18.6 10*3/uL — ABNORMAL HIGH (ref 4.0–10.5)
nRBC: 0 % (ref 0.0–0.2)

## 2023-11-21 SURGERY — Surgical Case
Anesthesia: Spinal

## 2023-11-21 MED ORDER — GABAPENTIN 100 MG PO CAPS
100.0000 mg | ORAL_CAPSULE | Freq: Three times a day (TID) | ORAL | Status: DC
  Administered 2023-11-21 – 2023-11-24 (×10): 100 mg via ORAL
  Filled 2023-11-21 (×10): qty 1

## 2023-11-21 MED ORDER — FENTANYL CITRATE (PF) 100 MCG/2ML IJ SOLN
INTRAMUSCULAR | Status: AC
  Filled 2023-11-21: qty 2

## 2023-11-21 MED ORDER — OXYTOCIN-SODIUM CHLORIDE 30-0.9 UT/500ML-% IV SOLN
2.5000 [IU]/h | INTRAVENOUS | Status: AC
  Administered 2023-11-21: 2.5 [IU]/h via INTRAVENOUS

## 2023-11-21 MED ORDER — SODIUM CHLORIDE 0.9% FLUSH: 3.0000 mL | INTRAVENOUS | Status: DC | PRN

## 2023-11-21 MED ORDER — SOD CITRATE-CITRIC ACID 500-334 MG/5ML PO SOLN: 30.0000 mL | ORAL | Status: DC

## 2023-11-21 MED ORDER — DIPHENHYDRAMINE HCL 50 MG/ML IJ SOLN
INTRAMUSCULAR | Status: AC
Start: 2023-11-21 — End: ?
  Filled 2023-11-21: qty 1

## 2023-11-21 MED ORDER — CYCLOBENZAPRINE HCL 10 MG PO TABS
10.0000 mg | ORAL_TABLET | Freq: Two times a day (BID) | ORAL | Status: DC | PRN
  Administered 2023-11-21: 10 mg via ORAL
  Filled 2023-11-21: qty 1

## 2023-11-21 MED ORDER — SENNOSIDES-DOCUSATE SODIUM 8.6-50 MG PO TABS
2.0000 | ORAL_TABLET | Freq: Every day | ORAL | Status: DC
  Administered 2023-11-22 – 2023-11-24 (×3): 2 via ORAL
  Filled 2023-11-21 (×3): qty 2

## 2023-11-21 MED ORDER — SODIUM CHLORIDE 0.9 % IV SOLN
INTRAVENOUS | Status: AC
  Filled 2023-11-21: qty 5

## 2023-11-21 MED ORDER — WITCH HAZEL-GLYCERIN EX PADS: 1.0000 | MEDICATED_PAD | CUTANEOUS | Status: DC | PRN

## 2023-11-21 MED ORDER — BUPIVACAINE IN DEXTROSE 0.75-8.25 % IT SOLN
INTRATHECAL | Status: DC | PRN
  Administered 2023-11-21: 1.6 mL via INTRATHECAL

## 2023-11-21 MED ORDER — ACETAMINOPHEN 500 MG PO TABS: 1000.0000 mg | ORAL_TABLET | Freq: Four times a day (QID) | ORAL | Status: DC

## 2023-11-21 MED ORDER — DEXAMETHASONE SODIUM PHOSPHATE 10 MG/ML IJ SOLN
INTRAMUSCULAR | Status: DC | PRN
  Administered 2023-11-21: 10 mg via INTRAVENOUS

## 2023-11-21 MED ORDER — ALBUTEROL SULFATE (2.5 MG/3ML) 0.083% IN NEBU: 3.0000 mL | INHALATION_SOLUTION | Freq: Four times a day (QID) | RESPIRATORY_TRACT | Status: DC | PRN

## 2023-11-21 MED ORDER — KETOROLAC TROMETHAMINE 30 MG/ML IJ SOLN
30.0000 mg | Freq: Four times a day (QID) | INTRAMUSCULAR | Status: AC
  Administered 2023-11-21: 30 mg via INTRAVENOUS
  Filled 2023-11-21 (×2): qty 1

## 2023-11-21 MED ORDER — FENTANYL CITRATE (PF) 100 MCG/2ML IJ SOLN
INTRAMUSCULAR | Status: DC | PRN
  Administered 2023-11-21: 15 ug via INTRATHECAL

## 2023-11-21 MED ORDER — ZOLPIDEM TARTRATE 5 MG PO TABS: 5.0000 mg | ORAL_TABLET | Freq: Every evening | ORAL | Status: DC | PRN

## 2023-11-21 MED ORDER — COCONUT OIL OIL
1.0000 | TOPICAL_OIL | Status: DC | PRN
  Administered 2023-11-22 – 2023-11-23 (×3): 1 via TOPICAL

## 2023-11-21 MED ORDER — TERBUTALINE SULFATE 1 MG/ML IJ SOLN: 0.2500 mg | Freq: Once | INTRAMUSCULAR | Status: DC

## 2023-11-21 MED ORDER — IBUPROFEN 800 MG PO TABS
800.0000 mg | ORAL_TABLET | Freq: Three times a day (TID) | ORAL | Status: DC
  Administered 2023-11-21 – 2023-11-24 (×9): 800 mg via ORAL
  Filled 2023-11-21 (×9): qty 1

## 2023-11-21 MED ORDER — OXYTOCIN-SODIUM CHLORIDE 30-0.9 UT/500ML-% IV SOLN
INTRAVENOUS | Status: DC | PRN
  Administered 2023-11-21: 30 [IU] via INTRAVENOUS

## 2023-11-21 MED ORDER — DIBUCAINE (PERIANAL) 1 % EX OINT: 1.0000 | TOPICAL_OINTMENT | CUTANEOUS | Status: DC | PRN

## 2023-11-21 MED ORDER — SODIUM CHLORIDE 0.9 % IV SOLN
500.0000 mg | INTRAVENOUS | Status: AC
  Administered 2023-11-21: 500 mg via INTRAVENOUS

## 2023-11-21 MED ORDER — MORPHINE SULFATE (PF) 0.5 MG/ML IJ SOLN
INTRAMUSCULAR | Status: DC | PRN
  Administered 2023-11-21: 150 ug via INTRATHECAL

## 2023-11-21 MED ORDER — ENOXAPARIN SODIUM 60 MG/0.6ML IJ SOSY
50.0000 mg | PREFILLED_SYRINGE | INTRAMUSCULAR | Status: DC
  Administered 2023-11-21 – 2023-11-23 (×3): 50 mg via SUBCUTANEOUS
  Filled 2023-11-21 (×3): qty 0.6

## 2023-11-21 MED ORDER — NICOTINE 21 MG/24HR TD PT24
21.0000 mg | MEDICATED_PATCH | Freq: Every day | TRANSDERMAL | Status: DC
  Administered 2023-11-21 – 2023-11-24 (×4): 21 mg via TRANSDERMAL
  Filled 2023-11-21 (×4): qty 1

## 2023-11-21 MED ORDER — ACETAMINOPHEN 10 MG/ML IV SOLN
INTRAVENOUS | Status: DC | PRN
  Administered 2023-11-21: 1000 mg via INTRAVENOUS

## 2023-11-21 MED ORDER — KETOROLAC TROMETHAMINE 30 MG/ML IJ SOLN
INTRAMUSCULAR | Status: AC
Start: 2023-11-21 — End: ?
  Filled 2023-11-21: qty 1

## 2023-11-21 MED ORDER — DEXAMETHASONE SODIUM PHOSPHATE 10 MG/ML IJ SOLN
INTRAMUSCULAR | Status: AC
  Filled 2023-11-21: qty 1

## 2023-11-21 MED ORDER — NALOXONE HCL 4 MG/10ML IJ SOLN: 1.0000 ug/kg/h | INTRAVENOUS | Status: DC | PRN

## 2023-11-21 MED ORDER — MENTHOL 3 MG MT LOZG: 1.0000 | LOZENGE | OROMUCOSAL | Status: DC | PRN

## 2023-11-21 MED ORDER — TERBUTALINE SULFATE 1 MG/ML IJ SOLN
INTRAMUSCULAR | Status: AC
  Filled 2023-11-21: qty 1

## 2023-11-21 MED ORDER — HYDROMORPHONE HCL 1 MG/ML IJ SOLN
2.0000 mg | Freq: Once | INTRAMUSCULAR | Status: AC
  Administered 2023-11-21: 2 mg via INTRAVENOUS
  Filled 2023-11-21: qty 2

## 2023-11-21 MED ORDER — LACTATED RINGERS IV SOLN: INTRAVENOUS | Status: DC | PRN

## 2023-11-21 MED ORDER — ONDANSETRON HCL 4 MG/2ML IJ SOLN
INTRAMUSCULAR | Status: AC
  Filled 2023-11-21: qty 2

## 2023-11-21 MED ORDER — MEDROXYPROGESTERONE ACETATE 150 MG/ML IM SUSP: 150.0000 mg | INTRAMUSCULAR | Status: DC | PRN

## 2023-11-21 MED ORDER — KETOROLAC TROMETHAMINE 30 MG/ML IJ SOLN: 30.0000 mg | Freq: Four times a day (QID) | INTRAMUSCULAR | Status: DC | PRN

## 2023-11-21 MED ORDER — CEFAZOLIN SODIUM-DEXTROSE 2-4 GM/100ML-% IV SOLN
2.0000 g | INTRAVENOUS | Status: AC
  Administered 2023-11-21: 2 g via INTRAVENOUS
  Filled 2023-11-21: qty 100

## 2023-11-21 MED ORDER — SODIUM CHLORIDE 0.9 % IV SOLN
INTRAVENOUS | Status: DC | PRN
Start: 2023-11-21 — End: 2023-11-21

## 2023-11-21 MED ORDER — ONDANSETRON HCL 4 MG/2ML IJ SOLN
INTRAMUSCULAR | Status: DC | PRN
  Administered 2023-11-21: 4 mg via INTRAVENOUS

## 2023-11-21 MED ORDER — MORPHINE SULFATE (PF) 0.5 MG/ML IJ SOLN
INTRAMUSCULAR | Status: AC
  Filled 2023-11-21: qty 10

## 2023-11-21 MED ORDER — METOCLOPRAMIDE HCL 5 MG/ML IJ SOLN
INTRAMUSCULAR | Status: AC
  Filled 2023-11-21: qty 2

## 2023-11-21 MED ORDER — ACETAMINOPHEN 500 MG PO TABS
1000.0000 mg | ORAL_TABLET | Freq: Three times a day (TID) | ORAL | Status: DC
  Administered 2023-11-21 – 2023-11-24 (×10): 1000 mg via ORAL
  Filled 2023-11-21 (×10): qty 2

## 2023-11-21 MED ORDER — OXYCODONE HCL 5 MG PO TABS
5.0000 mg | ORAL_TABLET | ORAL | Status: DC | PRN
Start: 2023-11-21 — End: 2023-11-24
  Administered 2023-11-21: 5 mg via ORAL
  Administered 2023-11-22 – 2023-11-24 (×11): 10 mg via ORAL
  Filled 2023-11-21 (×9): qty 2
  Filled 2023-11-21: qty 1
  Filled 2023-11-21 (×3): qty 2

## 2023-11-21 MED ORDER — MEASLES, MUMPS & RUBELLA VAC IJ SOLR: 0.5000 mL | Freq: Once | INTRAMUSCULAR | Status: DC

## 2023-11-21 MED ORDER — NALOXONE HCL 0.4 MG/ML IJ SOLN: 0.4000 mg | INTRAMUSCULAR | Status: DC | PRN

## 2023-11-21 MED ORDER — SIMETHICONE 80 MG PO CHEW
80.0000 mg | CHEWABLE_TABLET | Freq: Three times a day (TID) | ORAL | Status: DC
  Administered 2023-11-21 – 2023-11-24 (×11): 80 mg via ORAL
  Filled 2023-11-21 (×12): qty 1

## 2023-11-21 MED ORDER — ONDANSETRON HCL 4 MG/2ML IJ SOLN: 4.0000 mg | Freq: Three times a day (TID) | INTRAMUSCULAR | Status: DC | PRN

## 2023-11-21 MED ORDER — DIPHENHYDRAMINE HCL 25 MG PO CAPS
25.0000 mg | ORAL_CAPSULE | Freq: Four times a day (QID) | ORAL | Status: DC | PRN
  Administered 2023-11-21: 25 mg via ORAL
  Filled 2023-11-21: qty 1

## 2023-11-21 MED ORDER — SIMETHICONE 80 MG PO CHEW: 80.0000 mg | CHEWABLE_TABLET | ORAL | Status: DC | PRN

## 2023-11-21 MED ORDER — OXYTOCIN-SODIUM CHLORIDE 30-0.9 UT/500ML-% IV SOLN
INTRAVENOUS | Status: AC
  Filled 2023-11-21: qty 500

## 2023-11-21 MED ORDER — DIPHENHYDRAMINE HCL 50 MG/ML IJ SOLN
INTRAMUSCULAR | Status: DC | PRN
  Administered 2023-11-21: 25 mg via INTRAVENOUS

## 2023-11-21 MED ORDER — DIPHENHYDRAMINE HCL 25 MG PO CAPS: 25.0000 mg | ORAL_CAPSULE | ORAL | Status: DC | PRN

## 2023-11-21 MED ORDER — KETOROLAC TROMETHAMINE 30 MG/ML IJ SOLN
30.0000 mg | Freq: Once | INTRAMUSCULAR | Status: DC
  Administered 2023-11-21: 30 mg via INTRAVENOUS

## 2023-11-21 MED ORDER — PRENATAL MULTIVITAMIN CH
1.0000 | ORAL_TABLET | Freq: Every day | ORAL | Status: DC
  Administered 2023-11-21 – 2023-11-24 (×4): 1 via ORAL
  Filled 2023-11-21 (×4): qty 1

## 2023-11-21 MED ORDER — ACETAMINOPHEN 10 MG/ML IV SOLN
INTRAVENOUS | Status: AC
  Filled 2023-11-21: qty 100

## 2023-11-21 MED ORDER — SODIUM CHLORIDE 0.9 % IR SOLN
Status: DC | PRN
  Administered 2023-11-21: 1

## 2023-11-21 MED ORDER — PHENYLEPHRINE HCL-NACL 20-0.9 MG/250ML-% IV SOLN
INTRAVENOUS | Status: DC | PRN
  Administered 2023-11-21: 60 ug/min via INTRAVENOUS

## 2023-11-21 MED ORDER — DROPERIDOL 2.5 MG/ML IJ SOLN: 0.6250 mg | Freq: Once | INTRAMUSCULAR | Status: DC | PRN

## 2023-11-21 MED ORDER — FENTANYL CITRATE (PF) 100 MCG/2ML IJ SOLN: 25.0000 ug | INTRAMUSCULAR | Status: DC | PRN

## 2023-11-21 MED ORDER — DIPHENHYDRAMINE HCL 50 MG/ML IJ SOLN: 12.5000 mg | INTRAMUSCULAR | Status: DC | PRN

## 2023-11-21 MED ORDER — FENTANYL CITRATE (PF) 100 MCG/2ML IJ SOLN: INTRAMUSCULAR | Status: DC | PRN

## 2023-11-21 SURGICAL SUPPLY — 28 items
BENZOIN TINCTURE PRP APPL 2/3 (GAUZE/BANDAGES/DRESSINGS) IMPLANT
CHLORAPREP W/TINT 26 (MISCELLANEOUS) ×2 IMPLANT
CLAMP UMBILICAL CORD (MISCELLANEOUS) ×1 IMPLANT
DERMABOND ADVANCED .7 DNX12 (GAUZE/BANDAGES/DRESSINGS) IMPLANT
DRSG OPSITE POSTOP 4X10 (GAUZE/BANDAGES/DRESSINGS) ×1 IMPLANT
ELECT REM PT RETURN 9FT ADLT (ELECTROSURGICAL) ×1 IMPLANT
ELECTRODE REM PT RTRN 9FT ADLT (ELECTROSURGICAL) ×1 IMPLANT
EXTRACTOR VACUUM M CUP 4 TUBE (SUCTIONS) IMPLANT
GLOVE BIOGEL PI IND STRL 6.5 (GLOVE) ×1 IMPLANT
GLOVE BIOGEL PI IND STRL 7.0 (GLOVE) ×1 IMPLANT
GLOVE SURG SS PI 6.5 STRL IVOR (GLOVE) ×1 IMPLANT
GOWN STRL REUS W/TWL LRG LVL3 (GOWN DISPOSABLE) ×2 IMPLANT
HEMOSTAT ARISTA ABSORB 3G PWDR (HEMOSTASIS) IMPLANT
KIT ABG SYR 3ML LUER SLIP (SYRINGE) IMPLANT
MAT PREVALON FULL STRYKER (MISCELLANEOUS) IMPLANT
NDL HYPO 25X5/8 SAFETYGLIDE (NEEDLE) IMPLANT
NEEDLE HYPO 25X5/8 SAFETYGLIDE (NEEDLE) IMPLANT
NS IRRIG 1000ML POUR BTL (IV SOLUTION) ×1 IMPLANT
PACK C SECTION WH (CUSTOM PROCEDURE TRAY) ×1 IMPLANT
PAD OB MATERNITY 4.3X12.25 (PERSONAL CARE ITEMS) ×1 IMPLANT
RTRCTR C-SECT PINK 25CM LRG (MISCELLANEOUS) IMPLANT
STRIP CLOSURE SKIN 1/2X4 (GAUZE/BANDAGES/DRESSINGS) IMPLANT
SUT PLAIN 0 NONE (SUTURE) IMPLANT
SUT VIC AB 0 CT1 36 (SUTURE) ×4 IMPLANT
SUT VIC AB 4-0 KS 27 (SUTURE) ×1 IMPLANT
TOWEL OR 17X24 6PK STRL BLUE (TOWEL DISPOSABLE) ×1 IMPLANT
TRAY FOLEY W/BAG SLVR 14FR LF (SET/KITS/TRAYS/PACK) ×1 IMPLANT
WATER STERILE IRR 1000ML POUR (IV SOLUTION) ×1 IMPLANT

## 2023-11-21 NOTE — Discharge Summary (Signed)
 Postpartum Discharge Summary      Patient Name: Pamela Wong DOB: 11/12/1995 MRN: [id number]  Date of admission: 11/13/2023 Delivery date:11/21/2023 Delivering provider: CONSTANT, PEGGY Date of discharge: 11/24/2023  Admitting diagnosis: Preterm labor in second trimester [O60.02] Intrauterine pregnancy: [redacted]w[redacted]d     Secondary diagnosis:  Principal Problem:   Status post primary low transverse cesarean section Active Problems:   Postpartum care following cesarean delivery   Obesity affecting pregnancy, antepartum   Alpha thalassemia silent carrier   Marijuana use during pregnancy   Tobacco use affecting pregnancy, antepartum   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 yo G1P0101 at [redacted]w[redacted]d was admitted to the hospital 11/13/2023 for preterm labor with bulging membranes. Received BMZ course and magnesium, and contractions stopped. Began to labor late 3/18. Babe in breech presentation, so cesarean delivery indicated. Delivery details are as follows:  Membrane Rupture Time/Date:  ,   Delivery Method:C-Section, Low Transverse Operative Delivery:N/A Details of operation can be found in separate operative note.  Patient had a postpartum course complicated by none.  She is ambulating, tolerating a regular diet, passing flatus, and urinating well. Patient is discharged home in stable condition on  11/24/23        Newborn Data: Birth date:11/21/2023 Birth time:1:02 AM Gender:Female Living status:Living Apgars:4 ,9  Weight:760 g    Magnesium Sulfate received: Yes: Neuroprotection BMZ received:  Yes Rhophylac:No MMR:No T-DaP:Given postpartum Flu: N/A RSV Vaccine received: No Transfusion:No  Immunizations received: Immunization History  Administered Date(s) Administered   PPD Test 11/29/2020   Tdap 12/12/2016    Physical exam  Vitals:   11/23/23 1625 11/23/23 2008 11/23/23 2359 11/24/23 0818  BP: 123/69 135/76 128/67 134/78  Pulse: 95 (!) 110 95 89  Resp: 19 18 17    Temp: 97.6 F (36.4 C) 98.3 F (36.8 C) 98.2 F (36.8 C) 98.5 F (36.9 C)  TempSrc: Oral Oral Oral Oral  SpO2: 100% 99% 100% 99%  Weight:      Height:       General: alert, cooperative, and no distress Lochia: appropriate Uterine Fundus: firm Incision: Healing well with no significant drainage DVT Evaluation: No evidence of DVT seen on physical exam. Labs: Lab Results  Component Value Date   WBC 18.6 (H) 11/21/2023   HGB 10.6 (L) 11/21/2023   HCT 32.0 (L) 11/21/2023   MCV 93.6 11/21/2023   PLT 158 11/21/2023      Latest Ref Rng & Units 03/17/2023   10:08 PM  CMP  Glucose 70 - 99 mg/dL 91   BUN 6 - 20 mg/dL 12   Creatinine 1.91 - 1.00 mg/dL 4.78   Sodium 295 - 621 mmol/L 133   Potassium 3.5 - 5.1 mmol/L 3.8   Chloride 98 - 111 mmol/L 103   CO2 22 - 32 mmol/L 22   Calcium 8.9 - 10.3 mg/dL 8.7   Total Protein 6.5 - 8.1 g/dL 8.4   Total Bilirubin 0.3 - 1.2 mg/dL  0.5   Alkaline Phos 38 - 126 U/L 46   AST 15 - 41 U/L 14   ALT 0 - 44 U/L 12    Edinburgh Score:    11/22/2023   12:00 PM  Edinburgh Postnatal Depression Scale Screening Tool  I have been able to laugh and see the funny side of things. 0  I have looked forward with enjoyment to things. 0  I have blamed myself unnecessarily when things went wrong. 0  I have been anxious or worried for no good reason. 0  I have felt scared or panicky for no good reason. 0  Things have been getting on top of me. 1  I have been so unhappy that I have had difficulty sleeping. 0  I have felt sad or miserable. 0  I have been so unhappy that I  have been crying. 0  The thought of harming myself has occurred to me. 0  Edinburgh Postnatal Depression Scale Total 1   No data recorded  After visit meds:  Allergies as of 11/24/2023   No Known Allergies      Medication List     TAKE these medications    albuterol 108 (90 Base) MCG/ACT inhaler Commonly known as: VENTOLIN HFA Inhale 1-2 puffs into the lungs every 6 (six) hours as needed for wheezing or shortness of breath.   aspirin EC 81 MG tablet Take 1 tablet (81 mg total) by mouth daily. Swallow whole.   furosemide 20 MG tablet Commonly known as: Lasix Take 1 tablet (20 mg total) by mouth daily for 5 days.   hydrOXYzine 25 MG tablet Commonly known as: ATARAX Take 1 tablet (25 mg total) by mouth 3 (three) times daily as needed for anxiety.   ibuprofen 600 MG tablet Commonly known as: ADVIL Take 1 tablet (600 mg total) by mouth every 6 (six) hours as needed.   ondansetron 4 MG tablet Commonly known as: ZOFRAN Take 1 tablet (4 mg total) by mouth every 8 (eight) hours as needed for nausea or vomiting.   ondansetron 8 MG disintegrating tablet Commonly known as: ZOFRAN-ODT Take 1 tablet (8 mg total) by mouth every 8 (eight) hours as needed.   oxyCODONE-acetaminophen 5-325 MG tablet Commonly known as: PERCOCET/ROXICET Take 1-2 tablets by mouth every 6 (six) hours as needed. What changed:  how much to take reasons to take this   potassium chloride SA 20 MEQ tablet Commonly known as: KLOR-CON M Take 2 tablets (40 mEq total) by mouth daily for 5 days.   PRENATAL ADULT GUMMY/DHA/FA PO Take 1 tablet by mouth daily.   promethazine 25 MG tablet Commonly known as: PHENERGAN Take 1 tablet (25 mg total) by mouth every 6 (six) hours as needed for nausea or vomiting.         Discharge home in stable condition Infant Feeding: Breast Infant Disposition:NICU Discharge instruction: per After Visit Summary and Postpartum booklet. Activity: Advance as tolerated.  Pelvic rest for 6 weeks.  Diet: routine diet Future Appointments: Future Appointments  Date Time Provider Department Center  01/02/2024  8:20 AM [facility]  01/02/2024  8:35 AM [place]anca Bing, MD [facility]   Follow up Visit:  Follow-up Information     [facility] Follow up in 1 week(s).   Specialty: Obstetrics and Gynecology Why: postop check, they will call you with an appointment Contact information: [address] [address] [phone]  Message sent to [facility] 3/19  Please schedule this patient for a In person postpartum visit in 6 weeks with the following provider: Any provider. Additional Postpartum F/U: Incision check 1 week  Low risk pregnancy complicated by:  preterm labor with delivery at [redacted]w[redacted]d Delivery mode:  C-Section, Low Transverse Anticipated Birth Control:  Unsure   11/24/2023 Reva Bores, MD

## 2023-11-21 NOTE — Progress Notes (Signed)
 Patient ID: Pamela Wong, female   DOB: May 23, 1996, 28 y.o.   MRN: [id number] Called by RN reports patient with worsening abdominal pain and ultrasound suggesting oligohydramnios. Patient uncomfortable with contractions. She denies any leakage of fluid, reporting vaginal bleeding with meconium visualized on the pad.   SVE: Difficult exam as patient is not able to tolerate. Fetal part present in the vagina with minimal cervix palpated surrounding presenting part which is at -2 station  Discussed delivery via cesarean section due to fetal malpresentation. Risks, benefits and alternatives were explained including but not limited to risks of bleeding, infection, damage to adjacent organs. Patient verbalized understanding and all questions were answered. Patient's mother was on the speaker phone during counseling and is in route to the hospital coming from [place]. Patient is aware that delivery cannot be delayed until her arrival. Terbutaline given. OR charge nurse notified

## 2023-11-21 NOTE — Anesthesia Postprocedure Evaluation (Signed)
 Anesthesia Post Note  Patient: Pamela Wong  Procedure(s) Performed: CESAREAN DELIVERY     Patient location during evaluation: PACU Anesthesia Type: Spinal Level of consciousness: awake and alert Pain management: pain level controlled Vital Signs Assessment: post-procedure vital signs reviewed and stable Respiratory status: spontaneous breathing, nonlabored ventilation and respiratory function stable Cardiovascular status: blood pressure returned to baseline Postop Assessment: no apparent nausea or vomiting, spinal receding, no headache and no backache Anesthetic complications: no   No notable events documented.  Last Vitals:  Vitals:   11/21/23 0245 11/21/23 0257  BP:  (!) 123/57  Pulse: 73 65  Resp: 19 14  Temp:  36.8 C  SpO2: 100% 100%    Last Pain:  Vitals:   11/21/23 0257  TempSrc: Oral  PainSc: 7    Pain Goal: Patients Stated Pain Goal: 4 (11/19/23 0053)    Shanda Howells

## 2023-11-21 NOTE — Transfer of Care (Signed)
 Immediate Anesthesia Transfer of Care Note  Patient: Pamela Wong  Procedure(s) Performed: CESAREAN DELIVERY  Patient Location: PACU  Anesthesia Type:Spinal  Level of Consciousness: awake, alert , and oriented  Airway & Oxygen Therapy: Patient Spontanous Breathing  Post-op Assessment: Report given to RN and Post -op Vital signs reviewed and stable  Post vital signs: Reviewed and stable  Last Vitals:  Vitals Value Taken Time  BP    Temp    Pulse 80 11/21/23 0150  Resp 17 11/21/23 0150  SpO2 100 % 11/21/23 0150  Vitals shown include unfiled device data.  Last Pain:  Vitals:   11/20/23 2330  TempSrc:   PainSc: 10-Worst pain ever      Patients Stated Pain Goal: 4 (11/19/23 0053)  Complications: No notable events documented.

## 2023-11-21 NOTE — Anesthesia Procedure Notes (Signed)
 Spinal  Patient location during procedure: OR Start time: 11/21/2023 12:40 AM End time: 11/21/2023 12:43 AM Reason for block: surgical anesthesia Staffing Performed: anesthesiologist  Anesthesiologist: Kaylyn Layer, MD Performed by: Kaylyn Layer, MD Authorized by: Kaylyn Layer, MD   Preanesthetic Checklist Completed: patient identified, IV checked, risks and benefits discussed, monitors and equipment checked, pre-op evaluation and timeout performed Spinal Block Patient position: sitting Prep: DuraPrep and site prepped and draped Patient monitoring: heart rate, continuous pulse ox and blood pressure Approach: midline Location: L3-4 Injection technique: single-shot Needle Needle type: Pencan  Needle gauge: 24 G Needle length: 10 cm Assessment Sensory level: T4 Events: CSF return Additional Notes Risks, benefits, and alternative discussed. Patient gave consent to procedure. Prepped and draped in sitting position. Clear CSF obtained after one needle pass. Positive terminal aspiration. No pain or paraesthesias with injection. Patient tolerated procedure well. Vital signs stable. Amalia Greenhouse, MD

## 2023-11-21 NOTE — Anesthesia Preprocedure Evaluation (Addendum)
 Anesthesia Evaluation  Patient identified by MRN, date of birth, ID band Patient awake    Reviewed: Allergy & Precautions, NPO status , Patient's Chart, lab work & pertinent test results  History of Anesthesia Complications Negative for: history of anesthetic complications  Airway Mallampati: II  TM Distance: >3 FB Neck ROM: Full    Dental  (+) Missing,    Pulmonary asthma , Current Smoker   Pulmonary exam normal        Cardiovascular negative cardio ROS Normal cardiovascular exam     Neuro/Psych    Depression    negative neurological ROS     GI/Hepatic negative GI ROS, Neg liver ROS,,,  Endo/Other  negative endocrine ROS    Renal/GU negative Renal ROS  negative genitourinary   Musculoskeletal negative musculoskeletal ROS (+)    Abdominal   Peds  Hematology negative hematology ROS (+)   Anesthesia Other Findings Day of surgery medications reviewed with patient.  Reproductive/Obstetrics (+) Pregnancy ([redacted]w[redacted]d, breech, laboring)                             Anesthesia Physical Anesthesia Plan  ASA: 2 and emergent  Anesthesia Plan: Spinal   Post-op Pain Management: Ofirmev IV (intra-op)*   Induction:   PONV Risk Score and Plan: 4 or greater and Treatment may vary due to age or medical condition, Ondansetron and Dexamethasone  Airway Management Planned: Natural Airway  Additional Equipment: None  Intra-op Plan:   Post-operative Plan:   Informed Consent: I have reviewed the patients History and Physical, chart, labs and discussed the procedure including the risks, benefits and alternatives for the proposed anesthesia with the patient or authorized representative who has indicated his/her understanding and acceptance.       Plan Discussed with: CRNA  Anesthesia Plan Comments:        Anesthesia Quick Evaluation

## 2023-11-21 NOTE — Op Note (Signed)
 Crissie Sickles PROCEDURE DATE: 11/13/2023 - 11/21/2023  PREOPERATIVE DIAGNOSIS: Intrauterine pregnancy at  [redacted]w[redacted]d weeks gestation; malpresentation: breech presentation and in labor  POSTOPERATIVE DIAGNOSIS: The same  PROCEDURE:     Cesarean Section  SURGEON:  Dr. Catalina Antigua  ASSISTANT: DR. Wylene Simmer  An experienced assistant was required given the standard of surgical care given the complexity of the case.  This assistant was needed for exposure, dissection, suctioning, retraction, instrument exchange, assisting with delivery with administration of fundal pressure, and for overall help during the procedure.  INDICATIONS: Pamela Wong is a 28 y.o. G1P0000 at [redacted]w[redacted]d scheduled for cesarean section secondary to malpresentation: breech in labor .  The risks of cesarean section discussed with the patient included but were not limited to: bleeding which may require transfusion or reoperation; infection which may require antibiotics; injury to bowel, bladder, ureters or other surrounding organs; injury to the fetus; need for additional procedures including hysterectomy in the event of a life-threatening hemorrhage; placental abnormalities wth subsequent pregnancies, incisional problems, thromboembolic phenomenon and other postoperative/anesthesia complications. The patient concurred with the proposed plan, giving informed written consent for the procedure.    FINDINGS:  Viable female infant in breech presentation.  Apgars not available at time of delivery.  Small amount of clear amniotic fluid.  Intact placenta, three vessel cord.  Normal uterus, fallopian tubes and ovaries bilaterally.  ANESTHESIA:    Spinal INTRAVENOUS FLUIDS:900 ml ESTIMATED BLOOD LOSS: 96 ml URINE OUTPUT:  25 ml SPECIMENS: Placenta sent to pathology COMPLICATIONS: None immediate  PROCEDURE IN DETAIL:  The patient received intravenous antibiotics and had sequential compression devices applied to her lower  extremities while in the preoperative area.  She was then taken to the operating room where anesthesia was induced and was found to be adequate. A foley catheter was placed into her bladder and attached to Macie Baum gravity. She was then placed in a dorsal supine position with a leftward tilt, and prepped and draped in a sterile manner. After an adequate timeout was performed, a Pfannenstiel skin incision was made with scalpel and carried through to the underlying layer of fascia. The fascia was incised in the midline and this incision was extended bilaterally bluntly.  The rectus muscles were separated in the midline bluntly and the peritoneum was entered bluntly. The Alexis self-retaining retractor was introduced into the abdominal cavity. Attention was turned to the lower uterine segment where a transverse hysterotomy was made with a scalpel and extended bilaterally bluntly. The infant was successfully delivered by first delivery presenting part which was the fetal head, followed by both arms and rest of the body. The cord was clamped and cut and infant was handed over to awaiting neonatology team. Uterine massage was then administered and the placenta delivered intact with three-vessel cord. The uterus was cleared of clot and debris.  The hysterotomy was closed with 0 Vicryl in a running locked fashion. Overall, excellent hemostasis was noted. The pelvis was cleared of all clot and debris. Hemostasis was confirmed on all surfaces.  The peritoneum and the muscles were reapproximated using 0 vicryl interrupted stitches. The fascia was then closed using 0 Vicryl in a running fashion.  The subcutaneous layer was reapproximated with plain gut and the skin was closed in a subcuticular fashion using 3.0 Vicryl. The patient tolerated the procedure well. Sponge, lap, instrument and needle counts were correct x 2. She was taken to the recovery room in stable condition.    Elmar Antigua ConstantMD  11/21/2023 1:24  AM

## 2023-11-21 NOTE — Lactation Note (Signed)
 This note was copied from a baby's chart.  NICU Lactation Consultation Note  Patient Name: Pamela Wong Today's Date: 11/21/2023 Age:28 hours  Reason for consult: Initial assessment; NICU baby; Primapara; 1st time breastfeeding; Preterm <34wks; Infant < 5lbs; Other (Comment) (Tobacco use, history of cocaine and THC 12/24)  SUBJECTIVE  LC in to visit with P1 Mom of baby "Pamela Wong" delivered by C/Section at [redacted]w[redacted]d.  Baby currently in the NICU on a jet vent and NPO currently.  LC asked Mom what her plans were in feeding baby.  She said she wanted to use breast milk but she is concerned because she smokes.  Talked to Mom regarding how baby was exposed to the nicotine during the pregnancy and will be less affected through the breast milk.  LC also encouraged Mom to stop smoking or taking any elicit drugs that may affect baby through her breast milk.  Explained to Mom the process of pumping 6-8 times per 24 hrs and the need to be consistent to support a full milk supply.  Mom's eyes widened and she stated that she knows nothing about pumping and asked if she would be helped.  LC reassured her that lactation is available throughout baby's stay in the NICU for support and Mom also was to sign up with [facility], but due to preterm labor, she missed her appointment.  LC stated she would email a referral and she would hear from them today or tomorrow.  LC reviewed briefly the amazing benefits of mother's own milk while baby is in the NICU.   Mom decided she would like to try.  Reviewed breast massage and hand expression and encouraged hands on pumping.  LC set up the pump and provided Mom with a pumping top.  Colostrum protectors in place and explained to Mom that they are to be discarded after 24 hrs.  Caring for the pump parts reviewed as well.  OBJECTIVE Infant data: Mother's Current Feeding Choice: Breast Milk and Donor Milk  O2 Device: Jet Vent FiO2 (%): 30 %  Infant feeding assessment No  data recorded  Maternal data: G1P0000 C-Section, Low Transverse Has patient been taught Hand Expression?: Yes Hand Expression Comments: tiny glistening of colostrum noted on nipple Significant Breast History:: ++ breast changes Current breast feeding challenges:: Infant admitted to NICU at [redacted]w[redacted]d Does the patient have breastfeeding experience prior to this delivery?: No Pumping frequency: Initiated pumping at 9 hrs post partum Flange Size: 21 Hands-free pumping top sizes: Small/Medium (Blue) Risk factor for low/delayed milk supply:: Infant separation, pump dependent  WIC Program: Yes WIC Referral Sent?: Yes What county?: [place]  ASSESSMENT Infant:  No data recorded Maternal: No data recorded INTERVENTIONS/PLAN Interventions: Interventions: Breast feeding basics reviewed; Skin to skin; Breast massage; Hand express; DEBP; Education; [employer] brochure; CDC Guidelines for Breast Pump Cleaning; NICU Pumping Log Tools: Pump; Flanges; Hands-free pumping top Pump Education: Setup, frequency, and cleaning; Milk Storage  Plan: 1- STS with baby when safe to do so 2- Massage breasts and hand express often 3- pump both breasts on initiation setting until volume expressed is >20 ml 4- ask for lactation prn  Consult Status: NICU follow-up NICU Follow-up type: New admission follow up   Pamela Wong 11/21/2023, 10:25 AM

## 2023-11-22 LAB — SURGICAL PATHOLOGY

## 2023-11-22 MED ORDER — ALBUTEROL SULFATE (2.5 MG/3ML) 0.083% IN NEBU
3.0000 mL | INHALATION_SOLUTION | Freq: Four times a day (QID) | RESPIRATORY_TRACT | Status: DC | PRN
  Administered 2023-11-23: 3 mL via RESPIRATORY_TRACT
  Filled 2023-11-22 (×2): qty 3

## 2023-11-22 NOTE — Lactation Note (Signed)
 This note was copied from a baby's chart.  NICU Lactation Consultation Note  Patient Name: Girl Kechia Yahnke GUYQI'H Date: 11/22/2023 Age:28 hours  Reason for consult: Follow-up assessment; NICU baby; Primapara; 1st time breastfeeding; Preterm <34wks; Infant < 5lbs; Other (Comment) (tobacco use, Hx of cocaine and THC in 08/2023)  SUBJECTIVE Visited with family of 66 66/5 weeks old AGA NICU female; Ms. Gillentine reported she's been pumping and getting enough colostrum to collect, praised her for her efforts. She's been keeping track of her pumping sessions in the pumping log; she voiced she's expecting to be discharged on Saturday, offered a [facility] loaner pump and let her know about the $ 30.00 refundable cash deposit since she's not eligible for a [employer] pump. Reviewed the importance of consistent pumping, pumping schedule and anticipatory guidelines.   OBJECTIVE Infant data: Mother's Current Feeding Choice: -- (NPO)  O2 Device: Jet Vent FiO2 (%): 35 %  Maternal data: G1P0000 C-Section, Low Transverse Has patient been taught Hand Expression?: Yes Hand Expression Comments: tiny glistening of colostrum noted on nipple Significant Breast History:: ++ breast changes Current breast feeding challenges:: Infant admitted to NICU at [redacted]w[redacted]d Does the patient have breastfeeding experience prior to this delivery?: No Pumping frequency: 4 times/24 hours Pumped volume: 1 mL (1-2 ml) Flange Size: 21 Hands-free pumping top sizes: Small/Medium (Blue) Risk factor for low/delayed milk supply:: Infant separation, pump dependent  WIC Program: Yes WIC Referral Sent?: Yes What county?: [place] Pump:  (loaner pump offered on 11/22/2023)  ASSESSMENT Infant: Feeding Status: NPO  Maternal: Milk volume: Normal  INTERVENTIONS/PLAN Interventions: Interventions: Breast feeding basics reviewed; Coconut oil; DEBP; Education Tools: Coconut oil Pump Education: Setup, frequency, and cleaning; Milk  Storage  Plan: STS once able to Massage and hand express both breasts prior/after pumping Pump both breasts on initiation mode every 3 hours for 15 minutes, ideally 8 pumping sessions/24 hours Call out for lactation once she's ready to do a loaner pump  No other support person at this time. All questions and concerns answered, family to contact [facility] services PRN.  Consult Status: NICU follow-up NICU Follow-up type: Maternal D/C visit   Debera Lat 11/22/2023, 3:32 PM

## 2023-11-22 NOTE — Progress Notes (Signed)
 POSTPARTUM PROGRESS NOTE  POD #1  Subjective:  Pamela Wong is a 28 y.o. G1P0000 s/p pLTCS at [redacted]w[redacted]d. Today she notes she is doing ok. She denies any problems with ambulating, voiding or po intake. Denies nausea or vomiting. She has not yet passed flatus, no BM.  Pain is tolerable with medication.  Lochia appropriate Denies fever/chills/chest pain/SOB.  no HA, no blurry vision, no RUQ pain  Objective: Blood pressure (!) 108/54, pulse 65, temperature 98.2 F (36.8 C), temperature source Oral, resp. rate 18, height 5\' 6"  (1.676 m), weight 100.2 kg, last menstrual period 06/03/2023, SpO2 98%.  Physical Exam:  General: alert, cooperative and no distress Chest: no respiratory distress Heart: regular rate and rhythm Abdomen: soft, nontender, +BS Uterine Fundus: firm, appropriately tender Incision: C/D/I with honeycomb DVT Evaluation: No calf swelling or tenderness Extremities: no edema Skin: warm, dry  Results for orders placed or performed during the hospital encounter of 11/13/23 (from the past 24 hours)  CBC     Status: Abnormal   Collection Time: 11/21/23  8:01 AM  Result Value Ref Range   WBC 18.6 (H) 4.0 - 10.5 K/uL   RBC 3.42 (L) 3.87 - 5.11 MIL/uL   Hemoglobin 10.6 (L) 12.0 - 15.0 g/dL   HCT 30.1 (L) 60.1 - 09.3 %   MCV 93.6 80.0 - 100.0 fL   MCH 31.0 26.0 - 34.0 pg   MCHC 33.1 30.0 - 36.0 g/dL   RDW 23.5 57.3 - 22.0 %   Platelets 158 150 - 400 K/uL   nRBC 0.0 0.0 - 0.2 %    Assessment/Plan: Pamela Wong is a 28 y.o. G1P0000 s/p pLTCS at [redacted]w[redacted]d POD#1  -pain manageable with current medication -encourage ambulation as tolerated -Lovenox for prophylaxis -pt requesting home albuterol- will confirm order -Chlamydia- currently being treated  Contraception: declined Feeding: pumping, baby in NICU  Dispo: Continue routine postop care, plan for discharge on POD#3   LOS: 9 days   Myna Hidalgo, DO Faculty Attending, Center for [facility] 11/22/2023,  7:39 AM

## 2023-11-22 NOTE — Clinical Social Work Maternal (Addendum)
 CLINICAL SOCIAL WORK MATERNAL/CHILD NOTE  Patient Details  Name: Pamela Wong MRN: [id number] Date of Birth: Dec 31, 1995  Date:  11/22/2023  Clinical Social Worker Initiating Note:  Vivi [place], [place] Date/Time: Initiated:  11/22/23/1105     Child's Name:  Pamela Wong   Biological Parents:  Mother (MOB: Pamela Wong 1996/02[birth year]02)   Need for Interpreter:  None   Reason for Referral:  (NICU admission)   Address: Current address: [phone] [facility]. [place], [place] [address]  [address] [place] [address]    Phone number:  [phone] (home)     Additional phone number:   Household Members/Support Persons (HM/SP):   Household Member/Support Person 1, Household Member/Support Person 2, Household Member/Support Person 3   HM/SP Name Relationship DOB or Age  HM/SP -1 Pamela Wong 01/01/1995  HM/SP -2 Pamela Wong Niece 03/28/2017  HM/SP -3 Pamela Wong Nephew 06/14/23  HM/SP -4        HM/SP -5        HM/SP -6        HM/SP -7        HM/SP -8          Natural Supports (not living in the home):  Immediate Family, Extended Family, Friends   Herbalist: None   Employment: Unemployed   Type of Work:     Education:  Engineer, agricultural   Homebound arranged:    Surveyor, quantity Resources:  [employer]   Other Resources:  Sales executive     Cultural/Religious Considerations Which May Impact Care:    Strengths:  Ability to meet basic needs  , Pediatrician chosen   Psychotropic Medications:         Pediatrician:    Armed forces operational officer area  Pediatrician List:   [facility] [employer]    [place]    [facility]      Pediatrician Fax Number:    Risk Factors/Current Problems:  Substance Use     Cognitive State:  Able to Concentrate  , Alert  , Insightful     Mood/Affect:  Calm  , Interested     CSW Assessment: CSW received consult for Parental support  premature baby <32 weeks, and carseat. CSW met with MOB to offer support and complete assessment.    CSW met with MOB at bedside room 1S09 and introduced CSW role. CSW observed MOB lying in bed with a visitor present at beside. MOB introduced her visitor as her sister, Pamela Wong. CSW offered MOB privacy to which MOB responded, "this is my sister you can share everything in front of her."  MOB presented calm and engaged appropriately with CSW throughout the assessment. CSW asked MOB for her baby girl's name. MOB replied that the baby's name was Pamela Wong. CSW inquired if FOB would be involved. MOB reported that FOB would not be involved. CSW inquired if the demographic information on hospital file was correct.  MOB reported that the address on hospital file was her sister'[name] address where she previously lived, but currently lived with her sister Pamela Wong and her Pamela Wong's children at [address]. [place], [place] [address] (see chart above). CSW inquired about MOB employment status. MOB reported that she was unemployed and received SNAP benefits. MOB reported that she would have to follow up with the [facility] office since she missed her appointment due to the early labor. CSW asked MOB how she had been  doing. MOB replied, "I been doing ok, trying to stay strong and praying for her (infant). I did cry this morning because I don't like seeing her (infant) like that." CSW validated MOB emotions. CSW asked MOB if the NICU staff had kept her updated regarding the infant's care. MOB reported that NICU staff had kept her informed and expressed that she was thankful for the care that the infant had received. MOB mentioned that she planned to attend a meeting for the NICU parents this evening. CSW inquired about MOB supports. MOB identified her sisters, mom, family, friends and doctors as supports.   CSW asked MOB if she had a mental health history. MOB denied a mental health history. CSW provided  education regarding the baby blues period vs. perinatal mood disorders, discussed treatment and gave resources for mental health follow up if concerns arise.  CSW recommended MOB completed a self-evaluation during the postpartum time period using the New Mom Checklist from Postpartum Progress and encouraged MOB to contact a medical professional if symptoms are noted at any time. MOB reported that she understood. CSW assessed MOB for safety. MOB denied SI/HI and domestic violence concerns.   CSW inquired about MOB's substance use during the pregnancy. MOB reported that she used marijuana and cocaine during the pregnancy. CSW asked MOB how often she used the substances and her last use. MOB reported that she smoked marijuana daily to help with her appetite and cope with stress. She reported the last time that she used marijuana was about two weeks ago. CSW inquired regarding MOB cocaine use. MOB reported that she used cocaine prior to learning about her pregnancy in November 2024. She had informed her provider that she had quit using cocaine in December 2024, but the last time she used was "at the end of January and beginning of February." MOB denied that she used cocaine daily and reported it was a Teacher, English as a foreign language drug."  CSW informed MOB about the hospital drug screen policy and that CSW would continue to monitor the infant cord drug screen and make a report to CPS, if warranted. MOB expressed feeling worried about CPS taking her baby. MOB's sister provided emotional support and told MOB, "Don't let your mind go there, it's going to be alright."  CSW offered MOB substance use resources. MOB replied, "I'm good now, it's just a mind thing" and declined the substance use resources.   CSW acknowledged the infant's weight, gestational age and discussed SSI. CSW encouraged MOB to follow up with SSI to determine if the infant was eligible for benefits. MOB reported that she understood. CSW inquired if MOB would be able to get  essential for the baby. MOB reported that she had planned to still have a baby shower for the infant but would probably need assistance with getting a car seat, clothes and a crib. CSW offered to make a referral to [employer] and provided MOB with information regarding her Medicaid managed plan assistance with baby items including a car seat. CSW encouraged MOB to follow up with her insurance plan. MOB mentioned that she would like for the infant see a pediatrician at [facility] since her sister's children also go there.  CSW provided MOB with a list of [facility] resources for new parents. MOB thanked CSW for the resources.   CSW offered to check in with MOB weekly to offer support while the infant remains in the [facility] MOB opted to call CSW if needs arise.  CSW Will Continue to Monitor Umbilical Cord Tissue Drug Screen Results and Make Report if Warranted.   CSW Plan/Description:  CSW Will Continue to Monitor Umbilical Cord Tissue Drug Screen Results and Make Report if [facility] Drug Screen Policy Information, Perinatal Mood and Anxiety Disorder (PMADs) Education, Other Information/Referral to [employer], [employer] Income (SSI) Information    Clearance Coots, LCSW 11/22/2023, 1:22 PM

## 2023-11-23 ENCOUNTER — Encounter (HOSPITAL_COMMUNITY): Payer: Self-pay | Admitting: Obstetrics & Gynecology

## 2023-11-23 MED ORDER — HYDROXYZINE HCL 50 MG PO TABS
25.0000 mg | ORAL_TABLET | Freq: Three times a day (TID) | ORAL | Status: DC | PRN
  Administered 2023-11-23 (×3): 25 mg via ORAL
  Filled 2023-11-23 (×3): qty 1

## 2023-11-23 NOTE — Progress Notes (Signed)
 POSTPARTUM PROGRESS NOTE  POD #2  Subjective:  Pamela Wong is a 28 y.o. G1P0000 s/p pLTCS at [redacted]w[redacted]d. Today she notes she is doing well. She denies any problems with ambulating, voiding or po intake. Denies nausea or vomiting. She has passed flatus, +BM.  Pain is well controlled.  Lochia minimal Denies fever/chills/chest pain/SOB.  no HA, no blurry vision, noRUQ pain  Objective: Blood pressure 132/80, pulse 98, temperature (!) 97.4 F (36.3 C), temperature source Oral, resp. rate 18, height 5\' 6"  (1.676 m), weight 100.2 kg, last menstrual period 06/03/2023, SpO2 100%.  Physical Exam:  General: alert, cooperative and no distress Chest: no respiratory distress Heart: regular rate and rhythm Abdomen: soft, nontender, +BS Uterine Fundus: firm, appropriately tender Incision: C/D/I with honeycomb DVT Evaluation: No calf swelling or tenderness Extremities: no edema Skin: warm, dry  No results found for this or any previous visit (from the past 24 hours).  Assessment/Plan: Pamela Wong is a 28 y.o. G1P0000 s/p pLTCS at [redacted]w[redacted]d POD#2 complicated by: -meeting postop milestones appropriately -encourage ambulation as tolerated -Lovenox for prophylaxis -continue home albuterol -s/p Azithromycin for Chlamydia treatment  Contraception: declined Feeding: baby in NICU, pumping  Dispo: plan for discharge home tomorrow   LOS: 10 days   Myna Hidalgo, DO Faculty Attending, Center for [employer] 11/23/2023, 7:16 AM

## 2023-11-23 NOTE — Plan of Care (Signed)
  Problem: Education: Goal: Knowledge of General Education information will improve Description: Including pain rating scale, medication(s)/side effects and non-pharmacologic comfort measures Outcome: Progressing   Problem: Health Behavior/Discharge Planning: Goal: Ability to manage health-related needs will improve Outcome: Progressing   Problem: Clinical Measurements: Goal: Ability to maintain clinical measurements within normal limits will improve Outcome: Progressing Goal: Will remain free from infection Outcome: Progressing Goal: Diagnostic test results will improve Outcome: Progressing Goal: Respiratory complications will improve Outcome: Progressing Goal: Cardiovascular complication will be avoided Outcome: Progressing   Problem: Clinical Measurements: Goal: Will remain free from infection Outcome: Progressing   Problem: Clinical Measurements: Goal: Diagnostic test results will improve Outcome: Progressing

## 2023-11-24 ENCOUNTER — Other Ambulatory Visit (HOSPITAL_COMMUNITY): Payer: Self-pay

## 2023-11-24 MED ORDER — FUROSEMIDE 20 MG PO TABS
20.0000 mg | ORAL_TABLET | Freq: Every day | ORAL | 0 refills | Status: DC
  Filled 2023-11-24: qty 5, 5d supply, fill #0

## 2023-11-24 MED ORDER — IBUPROFEN 600 MG PO TABS
600.0000 mg | ORAL_TABLET | Freq: Four times a day (QID) | ORAL | 1 refills | Status: DC | PRN
  Filled 2023-11-24: qty 30, 8d supply, fill #0

## 2023-11-24 MED ORDER — HYDROXYZINE HCL 25 MG PO TABS
25.0000 mg | ORAL_TABLET | Freq: Three times a day (TID) | ORAL | 0 refills | Status: DC | PRN
  Filled 2023-11-24: qty 30, 10d supply, fill #0

## 2023-11-24 MED ORDER — ALBUTEROL SULFATE HFA 108 (90 BASE) MCG/ACT IN AERS
1.0000 | INHALATION_SPRAY | Freq: Four times a day (QID) | RESPIRATORY_TRACT | Status: DC | PRN
  Administered 2023-11-24: 2 via RESPIRATORY_TRACT
  Filled 2023-11-24: qty 6.7

## 2023-11-24 MED ORDER — ALBUTEROL SULFATE HFA 108 (90 BASE) MCG/ACT IN AERS
1.0000 | INHALATION_SPRAY | Freq: Four times a day (QID) | RESPIRATORY_TRACT | 2 refills | Status: DC | PRN
  Filled 2023-11-24: qty 18, 25d supply, fill #0

## 2023-11-24 MED ORDER — OXYCODONE-ACETAMINOPHEN 5-325 MG PO TABS
1.0000 | ORAL_TABLET | Freq: Four times a day (QID) | ORAL | 0 refills | Status: DC | PRN
  Filled 2023-11-24: qty 20, 3d supply, fill #0

## 2023-11-24 MED ORDER — POTASSIUM CHLORIDE CRYS ER 20 MEQ PO TBCR
40.0000 meq | EXTENDED_RELEASE_TABLET | Freq: Every day | ORAL | 0 refills | Status: DC
  Filled 2023-11-24: qty 5, 2d supply, fill #0

## 2023-11-24 NOTE — Progress Notes (Addendum)
 CSW received consult stating MOB requested to speak with social work regarding resource needs (transportation and clothing for herself and infant). CSW met with MOB at infant's bedside in room 321 to assess for needs and provide support. When CSW entered room, MOB was holding infant skin to skin. CSW introduced self and explained reason for visit. MOB acknowledged request to speak with social work and requested any available resources, explaining that she did not know that infant "Lucretia Roers" was going to come early and she is in need of all infant essentials. CSW encouraged MOB to follow up with Medicaid regarding car seat and breast pump. CSW explained that her NICU CSW has made a referral to [employer] for infant essentials. CSW also provided MOB with information about [employer] financial scholarship, [employer] family market, and [facility]. MOB reported she plans to apply for SSI benefits.   CSW inquired about transportation needs. MOB reported that she is in the process of getting her car fixed and hopes to have it running again on 05/16/24, October 16, 2023 but needs assistance getting to the hospital to visit infant over the weekend. CSW provided MOB with 2 bus passes and Medicaid transportation information. CSW also explained that meal vouchers can be provided if MOB if needed once MOB is discharged from the hospital. MOB stated that meal vouchers would be helpful and expressed appreciation.    CSW to continue to provide support while infant remains in NICU.    Signed,  Norberto Sorenson, MSW, [place], LCASA 11-17-23 5:40 PM

## 2023-11-24 NOTE — Lactation Note (Signed)
 Lactation Consultation Note  Patient Name: Pamela Wong Date: 11/24/2023 Age:28 y.o.   LC attempted to provide [facility] loaner pump to mom. Mom was present in NICU room. Met there. She informed lactation that she would need to wait until her mother arrived with the $30 cash. Lactation to return to her room once called to assist with [facility] loaner processing. Mom understands to call out when ready.         [name] 11/24/2023, 10:42 AM

## 2023-11-27 ENCOUNTER — Encounter: Payer: Medicaid Other | Admitting: Certified Nurse Midwife

## 2023-11-28 ENCOUNTER — Ambulatory Visit (HOSPITAL_COMMUNITY): Payer: Self-pay

## 2023-11-28 NOTE — Lactation Note (Signed)
 This note was copied from a baby's chart.  NICU Lactation Consultation Note  Patient Name: Pamela Wong Date: 11/28/2023 Age:28 days  Reason for consult: Weekly NICU follow-up; Primapara; 1st time breastfeeding; NICU baby; Preterm <34wks; Infant < 5lbs; Other (Comment); Mother's request; RN request (tobacco use, Hx of cocaine and THC in 08/2023)  SUBJECTIVE Visited with family of 37 26/34 weeks old AGA NICU female "Pamela Wong"; Ms. Apodaca voiced she just got her pump from the [facility] office today, she was not able to do the loaner pump because of the # 30.00 refundable deposit. NICU RN Angelia voiced that she didn't take the hand piece attachment and was not able to pump since her discharge, she pumped again this morning and got 40 ml after 4 days of not pumping (see maternal assessment). She's trying to get on the 3-hour schedule now that she has the [facility] pump and everything she needs, praised her for her efforts. Reviewed pump settings and strategies to increase supply and let her know that consistency will play a key role in protecting her supply.  OBJECTIVE Infant data: Mother's Current Feeding Choice: Breast Milk and Donor Milk  O2 Device: Jet Vent FiO2 (%): 28 %  Maternal data: G1P0101 C-Section, Low Transverse Pumping frequency: 2 times/24 hours due to no pump at home Pumped volume: 40 mL (pumped 12 ml after her 40 ml pumping session)  WIC Program: Yes WIC Referral Sent?: Yes What county?: [place] Pump: WIC Pump  ASSESSMENT Infant: Feeding Status: Scheduled 8-11-2-5 Feeding method: Tube/Gavage (Bolus)  Maternal: Milk volume: Low No S/S of engorgement at this time, she reported her breasts felt "sore/tender" but they didn't get "hard" since her discharge from [facility]  INTERVENTIONS/PLAN Interventions: Interventions: Breast feeding basics reviewed; Coconut oil; DEBP; Education Discharge Education: Engorgement and breast care  Plan: STS once able to Massage and  hand express both breasts prior/after pumping Pump both breasts on maintain mode every 3 hours for 30 minutes, ideally 8 pumping sessions/24 hours Power pumping once/day   No other support person at this time. All questions and concerns answered, family to contact [facility] services PRN.  Consult Status: NICU follow-up NICU Follow-up type: Weekly NICU follow up   Charnell Peplinski S Philis Nettle 11/28/2023, 4:07 PM

## 2023-11-29 ENCOUNTER — Inpatient Hospital Stay (HOSPITAL_COMMUNITY)
Admission: AD | Admit: 2023-11-29 | Discharge: 2023-11-30 | Disposition: A | Discharge status: Home / Self Care | Attending: Family Medicine | Admitting: Family Medicine

## 2023-11-29 DIAGNOSIS — Z711 Person with feared health complaint in whom no diagnosis is made: Secondary | ICD-10-CM

## 2023-11-29 DIAGNOSIS — O9089 Other complications of the puerperium, not elsewhere classified: Secondary | ICD-10-CM | POA: Insufficient documentation

## 2023-11-29 DIAGNOSIS — T148XXA Other injury of unspecified body region, initial encounter: Secondary | ICD-10-CM

## 2023-11-29 DIAGNOSIS — G8918 Other acute postprocedural pain: Secondary | ICD-10-CM

## 2023-11-29 NOTE — MAU Note (Signed)
..  Pamela Wong is a 28 y.o. at [facility] c-section 8 days here in MAU reporting: Lower abdominal pain that is tightening, swollen, and numb and the pain is constant. Oxycodone and ibuprofen this morning and it did not help.  Pain score: 10/10 Vitals:   11/29/23 2243  BP: 139/86  Pulse: 71  Resp: 16  Temp: 99.1 F (37.3 C)  SpO2: 100%      Lab orders placed from triage:  UA

## 2023-11-29 NOTE — MAU Provider Note (Signed)
 History     CSN: [id number]  Arrival date and time: 11/29/23 2221   Event Date/Time   First Provider Initiated Contact with Patient 11/29/23 2302      Chief Complaint  Patient presents with   Abdominal Pain   Pamela Wong , a  28 y.o. G1P0101 at 6days PP presents to MAU with complaints of tenderness and redness around postpartum incision.  Patient reports that today the pain is increased currently rates as a 10 out of 10.  She also reports hardness to her lower abdomen just above the incision site.  She reports taking Motrin and oxycodone for pain this morning however pain unrelieved.  She denies problems with constipation.  Reports feeling lightheaded and dizzy over the past few days but denies fevers and chills.       Abdominal Pain Associated symptoms include nausea. Pertinent negatives include no constipation, diarrhea, dysuria, fever, headaches or vomiting.    OB History     Gravida  1   Para  1   Term      Preterm  1   AB  0   Living  1      SAB      IAB      Ectopic  0   Multiple  0   Live Births  1           Past Medical History:  Diagnosis Date   Asthma    Depression    Fracture, ankle    rt and left when young   Hidradenitis suppurativa    PID (pelvic inflammatory disease) 03/2023   Trichomonas infection    UTI (urinary tract infection)     Past Surgical History:  Procedure Laterality Date   CESAREAN SECTION N/A 11/21/2023   Procedure: CESAREAN DELIVERY;  Surgeon: Catalina Antigua, MD;  Location: [facility] LD ORS;  Service: Obstetrics;  Laterality: N/A;   NO PAST SURGERIES      Family History  Problem Relation Age of Onset   Hypertension Mother    Other Father        unknown hx    Social History   Tobacco Use   Smoking status: Every Day    Types: Cigars   Smokeless tobacco: Never   Tobacco comments:    Black and milds  Vaping Use   Vaping status: Former   Substances: THC  Substance Use Topics   Alcohol use: Not  Currently    Comment: "sometimes" - last was a couple wks ago   Drug use: Yes    Types: Marijuana    Comment: marijuana last 08/13/23; stopped cocaine 08/10/23    Allergies: No Known Allergies  Medications Prior to Admission  Medication Sig Dispense Refill Last Dose/Taking   albuterol (VENTOLIN HFA) 108 (90 Base) MCG/ACT inhaler Inhale 1-2 puffs into the lungs every 6 (six) hours as needed for wheezing or shortness of breath. 18 g 2    aspirin EC 81 MG tablet Take 1 tablet (81 mg total) by mouth daily. Swallow whole. 30 tablet 12    furosemide (LASIX) 20 MG tablet Take 1 tablet (20 mg total) by mouth daily for 5 days. 5 tablet 0    hydrOXYzine (ATARAX) 25 MG tablet Take 1 tablet (25 mg total) by mouth 3 (three) times daily as needed for anxiety. 30 tablet 0    ibuprofen (ADVIL) 600 MG tablet Take 1 tablet (600 mg total) by mouth every 6 (six) hours as needed. 30 tablet 1  ondansetron (ZOFRAN) 4 MG tablet Take 1 tablet (4 mg total) by mouth every 8 (eight) hours as needed for nausea or vomiting. 30 tablet 2    ondansetron (ZOFRAN-ODT) 8 MG disintegrating tablet Take 1 tablet (8 mg total) by mouth every 8 (eight) hours as needed. 30 tablet 3    oxyCODONE-acetaminophen (PERCOCET/ROXICET) 5-325 MG tablet Take 1-2 tablets by mouth every 6 (six) hours as needed. 20 tablet 0    potassium chloride SA (KLOR-CON M) 20 MEQ tablet Take 2 tablets (40 mEq total) by mouth daily for 5 days. 5 tablet 0    Prenatal MV & Min w/FA-DHA (PRENATAL ADULT GUMMY/DHA/FA PO) Take 1 tablet by mouth daily.      promethazine (PHENERGAN) 25 MG tablet Take 1 tablet (25 mg total) by mouth every 6 (six) hours as needed for nausea or vomiting. 30 tablet 3     Review of Systems  Constitutional:  Negative for chills, fatigue and fever.  Eyes:  Negative for pain and visual disturbance.  Respiratory:  Negative for apnea, shortness of breath and wheezing.   Cardiovascular:  Negative for chest pain and palpitations.   Gastrointestinal:  Positive for abdominal distention, abdominal pain and nausea. Negative for constipation, diarrhea and vomiting.  Genitourinary:  Negative for difficulty urinating, dysuria, pelvic pain, vaginal bleeding, vaginal discharge and vaginal pain.  Musculoskeletal:  Negative for back pain.  Skin:  Positive for color change.  Neurological:  Negative for seizures, weakness and headaches.  Psychiatric/Behavioral:  Negative for suicidal ideas.    Physical Exam   Blood pressure 139/86, pulse 71, temperature 99.1 F (37.3 C), temperature source Oral, resp. rate 16, height 5\' 6"  (1.676 m), weight 102.5 kg, SpO2 100%, unknown if currently breastfeeding.  Physical Exam Vitals and nursing note reviewed.  Constitutional:      General: She is not in acute distress.    Appearance: Normal appearance.  HENT:     Head: Normocephalic.  Pulmonary:     Effort: Pulmonary effort is normal.  Abdominal:     Palpations: Abdomen is soft.     Tenderness: There is abdominal tenderness in the right lower quadrant and left lower quadrant. There is guarding.     Comments: Skin on abdomen red and tender on palpation. Please see imaging. Skin taut   Musculoskeletal:     Cervical back: Normal range of motion.  Skin:    General: Skin is warm and dry.  Neurological:     Mental Status: She is alert and oriented to person, place, and time.  Psychiatric:        Mood and Affect: Mood normal.     MAU Course  Procedures Orders Placed This Encounter  Procedures   Discharge patient Discharge disposition: 01-Home or Self Care; Discharge patient date: 11/29/2023   Meds ordered this encounter  Medications   gabapentin (NEURONTIN) capsule 100 mg   gabapentin (NEURONTIN) 100 MG capsule    Sig: Take 1 capsule (100 mg total) by mouth 2 (two) times daily.    Dispense:  15 capsule    Refill:  1    Supervising Provider:   Samara Snide    MDM - Call placed to Dr. Jolayne Panther to evaluate.  -Dr.  Jolayne Panther to bedside and reviewed incision and current clinical picture. Dr. Jolayne Panther discussed with patient expected findings after postpartum cesarean.  -Bruising noted and to be expected patient afebrile low suspicion for abscess  -plan for discharge  Assessment and Plan   1. Postoperative pain  2. Bruising   3. Postpartum care following cesarean delivery   4. Physically well but worried    -  reviewed worsening signs and return precautions. -Discussed expectations surrounding postpartum care after cesarean delivery -Message sent to the office to have patient scheduled for 1 week follow-up for an incision check. -Patient discharged home in stable condition and may return to MAU as needed.  Claudette Head, MSN CNM  11/29/2023, 11:02 PM

## 2023-11-30 DIAGNOSIS — G8918 Other acute postprocedural pain: Secondary | ICD-10-CM | POA: Diagnosis present

## 2023-11-30 DIAGNOSIS — Z711 Person with feared health complaint in whom no diagnosis is made: Secondary | ICD-10-CM | POA: Diagnosis not present

## 2023-11-30 DIAGNOSIS — O9089 Other complications of the puerperium, not elsewhere classified: Secondary | ICD-10-CM | POA: Diagnosis present

## 2023-11-30 MED ORDER — GABAPENTIN 100 MG PO CAPS
100.0000 mg | ORAL_CAPSULE | Freq: Two times a day (BID) | ORAL | Status: DC
  Administered 2023-11-30: 100 mg via ORAL
  Filled 2023-11-30: qty 1

## 2023-11-30 MED ORDER — GABAPENTIN 100 MG PO CAPS: 100.0000 mg | ORAL_CAPSULE | Freq: Two times a day (BID) | ORAL | 1 refills | Status: DC

## 2023-12-01 ENCOUNTER — Telehealth (HOSPITAL_COMMUNITY): Payer: Self-pay

## 2023-12-01 NOTE — Telephone Encounter (Signed)
 12/01/2023 1359  Name: Pamela Wong MRN: [id number] DOB: 02-Jan-1996  Reason for Call:  Transition of Care Hospital Discharge Call  Contact Status: Patient Contact Status: Complete  Language assistant needed:          Follow-Up Questions: Do You Have Any Concerns About Your Health As You Heal From Delivery?: No Do You Have Any Concerns About Your Infants Health?: Infant in NICU  Edinburgh Postnatal Depression Scale:  In the Past 7 Days: I have been able to laugh and see the funny side of things.: As much as I always could I have looked forward with enjoyment to things.: As much as I ever did I have blamed myself unnecessarily when things went wrong.: No, never I have been anxious or worried for no good reason.: No, not at all I have felt scared or panicky for no good reason.: No, not at all Things have been getting on top of me.: No, most of the time I have coped quite well I have been so unhappy that I have had difficulty sleeping.: Not at all I have felt sad or miserable.: No, not at all I have been so unhappy that I have been crying.: No, never The thought of harming myself has occurred to me.: Never Edinburgh Postnatal Depression Scale Total: 1  PHQ2-9 Depression Scale:     Discharge Follow-up: Edinburgh score requires follow up?: No Patient was advised of the following resources:: Breastfeeding Support Group, Support Group  Post-discharge interventions: NA  Signature  Signe Colt

## 2023-12-04 ENCOUNTER — Ambulatory Visit (HOSPITAL_COMMUNITY): Payer: Self-pay

## 2023-12-04 NOTE — Lactation Note (Signed)
 This note was copied from a baby's chart.  NICU Lactation Consultation Note  Patient Name: Girl Obie Silos ZOXWR'U Date: 12/04/2023 Age:28  Reason for consult: Follow-up assessment; Primapara; 1st time breastfeeding; Mother's request; NICU baby; Preterm <34wks; Infant < 5lbs  SUBJECTIVE  LC met with Mom of baby "Lucretia Roers" in the NICU.  Mom approached [facility] and said she tried and tried and said pumping is just not for her. Mom went 4 days without pumping and has pumped once a day, not today.  Mom had tears in her eyes.  This LC provided emotional support for this Mom and listened to her.  Reassured Mom that baby was receiving donor breast milk and that holding baby STS and talking to her would be something she could do.  Mom aware of LC support available to her.  OBJECTIVE Infant data: No data recorded O2 Device: Ventilator (iNAVA) FiO2 (%): 21 %  Infant feeding assessment No data recorded  Maternal data: G1P0101 C-Section, Low Transverse No data recorded WIC Program: Yes WIC Referral Sent?: Yes What county?: [place] Pump: WIC Pump  ASSESSMENT Infant:  Feeding Status: Scheduled 9-12-3-6 Feeding method: Tube/Gavage (Bolus)  Maternal: No data recorded INTERVENTIONS/PLAN Interventions: No data recorded Plan: Consult Status: Complete   Judee Clara 12/04/2023, 12:29 PM

## 2023-12-06 ENCOUNTER — Ambulatory Visit (INDEPENDENT_AMBULATORY_CARE_PROVIDER_SITE_OTHER): Admitting: General Practice

## 2023-12-06 VITALS — BP 126/79 | HR 91 | Ht 66.0 in | Wt 220.0 lb

## 2023-12-06 DIAGNOSIS — Z5189 Encounter for other specified aftercare: Secondary | ICD-10-CM

## 2023-12-06 DIAGNOSIS — Z98891 History of uterine scar from previous surgery: Secondary | ICD-10-CM

## 2023-12-06 MED ORDER — HYDROXYZINE HCL 25 MG PO TABS: 25.0000 mg | ORAL_TABLET | Freq: Three times a day (TID) | ORAL | 0 refills | Status: DC | PRN

## 2023-12-06 MED ORDER — PRENATAL 27-1 MG PO TABS: 1.0000 | ORAL_TABLET | Freq: Every day | ORAL | 6 refills | Status: DC

## 2023-12-06 MED ORDER — DOXYCYCLINE HYCLATE 100 MG PO TABS: 100.0000 mg | ORAL_TABLET | Freq: Two times a day (BID) | ORAL | 0 refills | Status: AC

## 2023-12-06 NOTE — Progress Notes (Signed)
 Patient presents to office today for wound check following primary c-section 3/19. She reports doing well since then. She had a headache last night unrelieved by ibuprofen- no dizziness or blurry vision. BP 126/79 today. Patient would like refill on hydroxyzine & PNV- sent to pharmacy per Dr Crissie Reese. Incision appears to be healing well & is approximated. Hard, warm area of skin noted across top of incision that is tender to palpation. Dr Crissie Reese will come in to assess.   Wound care reviewed with patient.   Chase Caller RN BSN 12/06/23

## 2023-12-11 ENCOUNTER — Other Ambulatory Visit: Payer: Self-pay

## 2023-12-11 ENCOUNTER — Ambulatory Visit: Admitting: *Deleted

## 2023-12-11 MED ORDER — OXYCODONE-ACETAMINOPHEN 5-325 MG PO TABS: 1.0000 | ORAL_TABLET | Freq: Four times a day (QID) | ORAL | 0 refills | Status: DC | PRN

## 2023-12-11 NOTE — Progress Notes (Signed)
 Here for wound check after last wound check visit 4/3 when cellulitis was suspected. States did not start doxycycline until yesterday due to no ride. States wound hurts a little more on right side. Wound CDI but warm to touch above incision with hardened areas palpated. Tender to touch. Dr. Crissie Reese in to view wound. Advised take med as ordered. Let us know if it does not get better or gets worse. Patient requested refill of pain med. Dr. Crissie Reese will send in refill. Nancy Fetter

## 2023-12-12 ENCOUNTER — Telehealth: Payer: Self-pay | Admitting: *Deleted

## 2023-12-12 NOTE — Telephone Encounter (Signed)
 I called patient and informed her we had decided we would like to schedule another wound check before her postpartum. She agreed to appointment Monday 12/17/23. Pamela Wong

## 2023-12-13 ENCOUNTER — Inpatient Hospital Stay (HOSPITAL_COMMUNITY)
Admission: AD | Admit: 2023-12-13 | Discharge: 2023-12-13 | Disposition: A | Discharge status: Home / Self Care | Payer: Self-pay | Attending: Obstetrics & Gynecology | Admitting: Obstetrics & Gynecology

## 2023-12-13 DIAGNOSIS — R519 Headache, unspecified: Secondary | ICD-10-CM

## 2023-12-13 DIAGNOSIS — K529 Noninfective gastroenteritis and colitis, unspecified: Secondary | ICD-10-CM | POA: Diagnosis not present

## 2023-12-13 DIAGNOSIS — R112 Nausea with vomiting, unspecified: Secondary | ICD-10-CM | POA: Diagnosis not present

## 2023-12-13 DIAGNOSIS — R197 Diarrhea, unspecified: Secondary | ICD-10-CM | POA: Diagnosis not present

## 2023-12-13 LAB — CBC
HCT: 39.8 % (ref 36.0–46.0)
Hemoglobin: 12.8 g/dL (ref 12.0–15.0)
MCH: 29.8 pg (ref 26.0–34.0)
MCHC: 32.2 g/dL (ref 30.0–36.0)
MCV: 92.8 fL (ref 80.0–100.0)
Platelets: 304 10*3/uL (ref 150–400)
RBC: 4.29 MIL/uL (ref 3.87–5.11)
RDW: 13.2 % (ref 11.5–15.5)
WBC: 8.6 10*3/uL (ref 4.0–10.5)
nRBC: 0 % (ref 0.0–0.2)

## 2023-12-13 LAB — COMPREHENSIVE METABOLIC PANEL WITH GFR
ALT: 14 U/L (ref 0–44)
AST: 16 U/L (ref 15–41)
Albumin: 3.6 g/dL (ref 3.5–5.0)
Alkaline Phosphatase: 52 U/L (ref 38–126)
Anion gap: 11 (ref 5–15)
BUN: 9 mg/dL (ref 6–20)
CO2: 21 mmol/L — ABNORMAL LOW (ref 22–32)
Calcium: 9 mg/dL (ref 8.9–10.3)
Chloride: 103 mmol/L (ref 98–111)
Creatinine, Ser: 0.56 mg/dL (ref 0.44–1.00)
GFR, Estimated: >60 mL/min (ref >60)
Glucose, Bld: 96 mg/dL (ref 70–99)
Potassium: 3.8 mmol/L (ref 3.5–5.1)
Sodium: 135 mmol/L (ref 135–145)
Total Bilirubin: 0.7 mg/dL (ref 0.0–1.2)
Total Protein: 8.3 g/dL — ABNORMAL HIGH (ref 6.5–8.1)

## 2023-12-13 MED ORDER — CYCLOBENZAPRINE HCL 10 MG PO TABS: 10.0000 mg | ORAL_TABLET | Freq: Two times a day (BID) | ORAL | 0 refills | Status: DC | PRN

## 2023-12-13 MED ORDER — CYCLOBENZAPRINE HCL 5 MG PO TABS
10.0000 mg | ORAL_TABLET | Freq: Once | ORAL | Status: AC
  Administered 2023-12-13: 10 mg via ORAL
  Filled 2023-12-13: qty 2

## 2023-12-13 MED ORDER — DIPHENHYDRAMINE HCL 25 MG PO CAPS
50.0000 mg | ORAL_CAPSULE | Freq: Once | ORAL | Status: AC
  Administered 2023-12-13: 50 mg via ORAL
  Filled 2023-12-13: qty 2

## 2023-12-13 MED ORDER — DIPHENHYDRAMINE HCL 25 MG PO TABS: 50.0000 mg | ORAL_TABLET | Freq: Four times a day (QID) | ORAL | 0 refills | Status: DC | PRN

## 2023-12-13 MED ORDER — DIPHENHYDRAMINE HCL 50 MG/ML IJ SOLN: 50.0000 mg | Freq: Once | INTRAMUSCULAR | Status: DC

## 2023-12-13 MED ORDER — ACETAMINOPHEN-CAFFEINE 500-65 MG PO TABS
2.0000 | ORAL_TABLET | Freq: Four times a day (QID) | ORAL | 0 refills | Status: DC | PRN
Start: 2023-12-13 — End: 2024-04-16

## 2023-12-13 MED ORDER — ONDANSETRON 4 MG PO TBDP
8.0000 mg | ORAL_TABLET | Freq: Once | ORAL | Status: AC
  Administered 2023-12-13: 8 mg via ORAL
  Filled 2023-12-13: qty 2

## 2023-12-13 MED ORDER — ACETAMINOPHEN-CAFFEINE 500-65 MG PO TABS
2.0000 | ORAL_TABLET | Freq: Once | ORAL | Status: AC
  Administered 2023-12-13: 2 via ORAL
  Filled 2023-12-13: qty 2

## 2023-12-13 NOTE — MAU Note (Signed)
 Pamela Wong is a 28 y.o. at here in MAU reporting: last night she went to the cafeteria and ate. Started throwing up and pooping(loose, wateryx5) about 15-33min later.  Got sick out of nowhere.  Hasn't been able to keep anything down.  Had zaxby's for the NICU moms.  She ate that and by the time she got to her daughter;s rm , it was coming up.  Stomach is cramping Her head hurts, hasn't taken anything.  Doesn't have anything here. Onset of complaint: last night.  Pain score: 10, she can't keep her eyes open Vitals:   12/13/23 1616  BP: 139/89  Pulse: (!) 118  Resp: 20  Temp: 99.5 F (37.5 C)  SpO2: 100%      Lab orders placed from triage: urine    Delivered 24.3, baby in NICU, "she is doing good, getting bigger". C/s on 3/19

## 2023-12-13 NOTE — MAU Provider Note (Signed)
 S [name] is a 28 y.o. G1P0101 patient who presents to MAU today with complaint of she went to the cafeteria last night to get some food since her infant is in The NICU and about 15 minutes afterwards she started Vomiting and having diarrhea. She reports she hasn't been able to keep anything down PO Food/Fluid since last night and now her head hurts really bad and she is having lower abdominal cramping.  She is PP  and status post a primary C - Section from 11/21/23 delivered at 24w 3d d/t malpresentation in PTL her PP course has been complicated by a questionable cellulitis at the site of the incision (12/06/23) in which she was prescribed antibiotics for.  Patient did not start antibiotics until 12/11/23  Review of Systems  All other systems reviewed and are negative.  Unless otherwise noted in HPI  O BP 139/89 (BP Location: Right Arm)   Pulse (!) 118   Temp 99.5 F (37.5 C) (Oral)   Resp 20   SpO2 100%   Breastfeeding No  Physical Exam Vitals and nursing note reviewed. Exam conducted with a chaperone present.  Constitutional:      General: She is not in acute distress.    Appearance: She is obese. She is not ill-appearing.  HENT:     Head: Normocephalic.  Cardiovascular:     Rate and Rhythm: Normal rate and regular rhythm.  Pulmonary:     Effort: Pulmonary effort is normal.     Breath sounds: Normal breath sounds.  Abdominal:     Palpations: Abdomen is soft.       Comments: Surgical incision is C/D/I with no erythema or edema noted - Taking antibiodics  Musculoskeletal:        General: Normal range of motion.     Cervical back: Normal range of motion.  Skin:    General: Skin is warm.  Neurological:     Mental Status: She is alert and oriented to person, place, and time.  Psychiatric:        Attention and Perception: She is attentive.        Mood and Affect: Affect is flat.        Speech: Speech is delayed.        Behavior: Behavior is slowed.      Orders Placed This Encounter  Procedures   CBC    Standing Status:   Standing    Number of Occurrences:   1   Comprehensive metabolic panel    Standing Status:   Standing    Number of Occurrences:   1   Discharge patient Discharge disposition: 01-Home or Self Care; Discharge patient date: 12/13/2023    Standing Status:   Standing    Number of Occurrences:   1    Discharge disposition:   01-Home or Self Care [1]    Discharge patient date:   12/13/2023    Meds ordered this encounter  Medications   acetaminophen-caffeine (EXCEDRIN TENSION HEADACHE) 500-65 MG per tablet 2 tablet   ondansetron (ZOFRAN-ODT) disintegrating tablet 8 mg   cyclobenzaprine (FLEXERIL) tablet 10 mg   DISCONTD: diphenhydrAMINE (BENADRYL) injection 50 mg   diphenhydrAMINE (BENADRYL) capsule 50 mg   cyclobenzaprine (FLEXERIL) 10 MG tablet    Sig: Take 1 tablet (10 mg total) by mouth 2 (two) times daily as needed for muscle spasms.    Dispense:  20 tablet    Refill:  0    Supervising Provider:  PRATT, TANYA S [[id number]]   acetaminophen-caffeine (EXCEDRIN TENSION HEADACHE) 500-65 MG TABS per tablet    Sig: Take 2 tablets by mouth 4 (four) times daily as needed (Foe headaches).    Dispense:  60 tablet    Refill:  0    Supervising Provider:   Reva Bores [[id number]]   diphenhydrAMINE (BENADRYL) 25 MG tablet    Sig: Take 2 tablets (50 mg total) by mouth every 6 (six) hours as needed for sleep (Headache).    Dispense:  30 tablet    Refill:  0    Supervising Provider:   Reva Bores [[id number]]     Results for orders placed or performed during the hospital encounter of 12/13/23 (from the past 24 hours)  CBC     Status: None   Collection Time: 12/13/23  5:12 PM  Result Value Ref Range   WBC 8.6 4.0 - 10.5 K/uL   RBC 4.29 3.87 - 5.11 MIL/uL   Hemoglobin 12.8 12.0 - 15.0 g/dL   HCT 16.1 09.6 - 04.5 %   MCV 92.8 80.0 - 100.0 fL   MCH 29.8 26.0 - 34.0 pg   MCHC 32.2 30.0 - 36.0 g/dL   RDW 40.9 81.1 - 91.4 %    Platelets 304 150 - 400 K/uL   nRBC 0.0 0.0 - 0.2 %  Comprehensive metabolic panel     Status: Abnormal   Collection Time: 12/13/23  5:12 PM  Result Value Ref Range   Sodium 135 135 - 145 mmol/L   Potassium 3.8 3.5 - 5.1 mmol/L   Chloride 103 98 - 111 mmol/L   CO2 21 (L) 22 - 32 mmol/L   Glucose, Bld 96 70 - 99 mg/dL   BUN 9 6 - 20 mg/dL   Creatinine, Ser 7.82 0.44 - 1.00 mg/dL   Calcium 9.0 8.9 - 95.6 mg/dL   Total Protein 8.3 (H) 6.5 - 8.1 g/dL   Albumin 3.6 3.5 - 5.0 g/dL   AST 16 15 - 41 U/L   ALT 14 0 - 44 U/L   Alkaline Phosphatase 52 38 - 126 U/L   Total Bilirubin 0.7 0.0 - 1.2 mg/dL   GFR, Estimated >21 >30 mL/min   Anion gap 11 5 - 15      MDM - Labs Unremarkable - HA improved slightly  - Incision is C/D/I appropriately tender to touch ( patient encouraged to finish her antibiotics prescribed)  Patient reassessed and still has concern for her HA said it's starting  to feel slightly better will order Benadryl and Flexeril since she is staying here in the NICU . Patient agreeable and then plan for discharge  HIGH  I have reviewed the patient chart and performed the physical exam . I have ordered & interpreted the lab results . Medications ordered as stated above/below.  A/P as described below.  Counseling and education provided and patient agreeable  with plan as described below. Verbalized understanding.    ASSESSMENT Medical screening exam complete  1. Acute nonintractable headache, unspecified headache type (Primary)  2. Nausea and vomiting, unspecified vomiting type  3. Diarrhea, unspecified type  4. Postpartum state  5. Gastroenteritis   PLAN Future Appointments  Date Time Provider Department Center  12/17/2023  2:20 PM [facility] NURSE [facility]  01/02/2024  8:20 AM [facility]  01/02/2024  8:35 AM [place], MD [facility]    Discharge from MAU in stable condition See AVS for full description of educational information  and  instructions provided to the patient at time of discharge Warning signs for worsening condition that would warrant emergency follow-up discussed Patient may return to MAU as needed   Colman Cater, NP 12/13/2023 6:00 PM

## 2023-12-17 ENCOUNTER — Other Ambulatory Visit: Payer: Medicaid Other

## 2023-12-17 ENCOUNTER — Encounter: Payer: Self-pay | Admitting: Obstetrics and Gynecology

## 2023-12-17 ENCOUNTER — Ambulatory Visit

## 2023-12-17 VITALS — BP 125/89 | HR 71 | Wt 214.1 lb

## 2023-12-17 DIAGNOSIS — Z5189 Encounter for other specified aftercare: Secondary | ICD-10-CM

## 2023-12-17 NOTE — Progress Notes (Signed)
 Incision Check Visit  Pamela Wong is here for incision check following primary c-section at [redacted]w[redacted]d on 11/21/23. Baby doing well in NICU. Incision is open to air; appears clean, dry, and intact with edges fully approximated. Surrounding area superior to incision continues to feel indurated. Area is tender to palpation. Denies pain or fever. Antibiotics for possible cellulitis taken 12/10/23-12/14/23. Cresenzo, MD to bedside for exam. Will return for PP visit on 01/02/24.  Edinburgh=0 Offered behavioral health services if desired in the future.  Fonda Hymen, RN 12/17/2023  1:57 PM

## 2024-01-02 ENCOUNTER — Ambulatory Visit: Admitting: Obstetrics and Gynecology

## 2024-01-02 ENCOUNTER — Other Ambulatory Visit

## 2024-01-03 ENCOUNTER — Other Ambulatory Visit (HOSPITAL_COMMUNITY)
Admission: RE | Admit: 2024-01-03 | Discharge: 2024-01-03 | Disposition: A | Discharge status: Home / Self Care | Source: Ambulatory Visit | Attending: Family Medicine | Admitting: Family Medicine

## 2024-01-03 ENCOUNTER — Ambulatory Visit: Admitting: Family Medicine

## 2024-01-03 ENCOUNTER — Encounter: Payer: Self-pay | Admitting: Family Medicine

## 2024-01-03 ENCOUNTER — Other Ambulatory Visit: Payer: Self-pay

## 2024-01-03 DIAGNOSIS — Z8751 Personal history of pre-term labor: Secondary | ICD-10-CM | POA: Diagnosis not present

## 2024-01-03 DIAGNOSIS — A5602 Chlamydial vulvovaginitis: Secondary | ICD-10-CM | POA: Insufficient documentation

## 2024-01-03 DIAGNOSIS — A5609 Other chlamydial infection of lower genitourinary tract: Secondary | ICD-10-CM | POA: Insufficient documentation

## 2024-01-03 DIAGNOSIS — Z98891 History of uterine scar from previous surgery: Secondary | ICD-10-CM

## 2024-01-03 DIAGNOSIS — F17200 Nicotine dependence, unspecified, uncomplicated: Secondary | ICD-10-CM

## 2024-01-03 MED ORDER — NICOTINE 21-14-7 MG/24HR TD KIT: 1.0000 | PACK | TRANSDERMAL | 0 refills | Status: DC

## 2024-01-03 MED ORDER — NICOTINE POLACRILEX 2 MG MT GUM: 2.0000 mg | CHEWING_GUM | OROMUCOSAL | 0 refills | Status: DC | PRN

## 2024-01-03 NOTE — Progress Notes (Signed)
 Post Partum Visit Note  Pamela Wong is a 28 y.o. G64P0101 female who presents for a postpartum visit. She is 6 weeks postpartum following a primary cesarean section.  I have fully reviewed the prenatal and intrapartum course. The delivery was at 24 gestational weeks.  Anesthesia: spinal. Postpartum course has been challenging due to extremely premature delivery at 24 weeks as well as concern for incisional infection for which she was prescribed doxycycline . Baby is still in NICU. Baby is feeding by donor breast milk. Bleeding staining only. Bowel function is normal. Bladder function is normal. Patient is sexually active. Contraception method is condoms. Postpartum depression screening: negative.   The pregnancy intention screening data noted above was reviewed. Potential methods of contraception were discussed. The patient elected to proceed with No data recorded.   Edinburgh Postnatal Depression Scale - 01/03/24 1518       Edinburgh Postnatal Depression Scale:  In the Past 7 Days   I have been able to laugh and see the funny side of things. 0    I have looked forward with enjoyment to things. 0    I have blamed myself unnecessarily when things went wrong. 0    I have been anxious or worried for no good reason. 0    I have felt scared or panicky for no good reason. 0    Things have been getting on top of me. 0    I have been so unhappy that I have had difficulty sleeping. 0    I have felt sad or miserable. 0    I have been so unhappy that I have been crying. 0    The thought of harming myself has occurred to me. 0    Edinburgh Postnatal Depression Scale Total 0             Health Maintenance Due  Topic Date Due   Pneumococcal Vaccine 7-57 Years old (1 of 2 - PCV) Never done   COVID-19 Vaccine (1 - 2024-25 season) Never done    The following portions of the patient's history were reviewed and updated as appropriate: allergies, current medications, past family history,  past medical history, past social history, past surgical history, and problem list.  Review of Systems Pertinent items noted in HPI and remainder of comprehensive ROS otherwise negative.  Objective:  BP 114/81   Pulse 86   Wt 224 lb 9.6 oz (101.9 kg)   BMI 35.71 kg/m    General:  alert, cooperative, and appears stated age   Breasts:  not indicated  Lungs: Comfortalbe on room air  Wound well approximated incision, no signs of infection  GU exam:  not indicated         Assessment:   Postpartum exam  Status post primary low transverse cesarean section  Chlamydia vaginitis/cervicitis  History of preterm labor  Normal postpartum exam.   Plan:   Essential components of care per ACOG recommendations:  1.  Mood and well being: Patient with negative depression screening today. Reviewed local resources for support.  - Patient tobacco use? Yes. Patient desires to quit? Yes.offered nicotine  replacement therapy which was accepted.  - hx of drug use? No.    2. Infant care and feeding:  -Patient currently breastmilk feeding? No.  -Social determinants of health (SDOH) reviewed in EPIC. The following needs were identified: housing, connected with patient navigator  3. Sexuality, contraception and birth spacing - Patient does not want a pregnancy in the next year.  Desired family size is unsure. - Reviewed reproductive life planning. Reviewed contraceptive methods based on pt preferences and effectiveness.  Patient declines contraception today.   - Discussed birth spacing of 18 months  4. Sleep and fatigue -Encouraged family/partner/community support of 4 hrs of uninterrupted sleep to help with mood and fatigue  5. Physical Recovery  - Discussed patients delivery and complications. She describes her labor as good. - Patient had a C-section emergent.  - Patient has urinary incontinence? No. - Patient is safe to resume physical and sexual activity  6.  Health Maintenance - HM  due items addressed No - up to date - Last pap smear  Diagnosis  Date Value Ref Range Status  08/21/2023   Final   - Negative for intraepithelial lesion or malignancy (NILM)   Pap smear not done at today's visit.  -Breast Cancer screening indicated? No.   7. Chronic Disease/Pregnancy Condition follow up:  see be;pw Postpartum exam See above  Status post primary low transverse cesarean section Incision very well appearing  Chlamydia vaginitis/cervicitis Positive on admission, no TOC on file, collected today  History of preterm labor Unclear from documentation if she has true history based cervical insufficiency as she did report significant pain on presentation Will need to be reviewed and considered for cerclage in any future pregnancy   Teena Feast, MD [facility]

## 2024-01-03 NOTE — Patient Instructions (Signed)

## 2024-01-04 ENCOUNTER — Other Ambulatory Visit

## 2024-01-04 ENCOUNTER — Other Ambulatory Visit: Payer: Self-pay

## 2024-01-04 DIAGNOSIS — O24419 Gestational diabetes mellitus in pregnancy, unspecified control: Secondary | ICD-10-CM

## 2024-01-05 LAB — GLUCOSE TOLERANCE, 2 HOURS
Glucose, 2 hour: 100 mg/dL (ref 70–139)
Glucose, GTT - Fasting: 96 mg/dL (ref 70–99)

## 2024-01-07 ENCOUNTER — Encounter: Payer: Self-pay | Admitting: Family Medicine

## 2024-01-07 LAB — CERVICOVAGINAL ANCILLARY ONLY
Chlamydia: NEGATIVE
Comment: NEGATIVE
Comment: NEGATIVE
Comment: NORMAL
Neisseria Gonorrhea: NEGATIVE
Trichomonas: NEGATIVE

## 2024-01-08 ENCOUNTER — Other Ambulatory Visit (INDEPENDENT_AMBULATORY_CARE_PROVIDER_SITE_OTHER): Payer: Self-pay | Admitting: Nurse Practitioner

## 2024-01-08 ENCOUNTER — Other Ambulatory Visit: Payer: Self-pay | Admitting: Family Medicine

## 2024-01-08 DIAGNOSIS — F17200 Nicotine dependence, unspecified, uncomplicated: Secondary | ICD-10-CM

## 2024-01-09 ENCOUNTER — Other Ambulatory Visit (HOSPITAL_COMMUNITY): Payer: Self-pay

## 2024-01-09 ENCOUNTER — Telehealth: Payer: Self-pay | Admitting: Lactation Services

## 2024-01-09 MED ORDER — NICOTINE POLACRILEX 2 MG MT GUM
2.0000 mg | CHEWING_GUM | OROMUCOSAL | 0 refills | Status: DC | PRN
  Filled 2024-01-09: qty 110, fill #0

## 2024-01-09 MED ORDER — HYDROXYZINE HCL 25 MG PO TABS
25.0000 mg | ORAL_TABLET | Freq: Three times a day (TID) | ORAL | 0 refills | Status: DC | PRN
  Filled 2024-01-09 – 2024-01-28 (×2): qty 30, 10d supply, fill #0

## 2024-01-09 MED ORDER — NICOTINE 21-14-7 MG/24HR TD KIT
1.0000 | PACK | TRANSDERMAL | 0 refills | Status: DC
  Filled 2024-01-09: qty 1, fill #0

## 2024-01-09 NOTE — Telephone Encounter (Signed)
 Received request from [facility] to clarify Nicotine  Patch instructions.   Called and spoke with [facility] where Rx was sent today and they report they are not able to order the Nicotine  Patch kit that was prescribed.   [employer] and spoke with Cato Cockayne, Pharmacist and they report they think they can order however not sure when it will come in. Confirmed dosages and how it is to be used. 6 weeks at 21 mg, then 2 weeks at 14 mg and then 7 mg for 2 weeks for total of 56 days.   Called and spoke with patient. She reports that she went to [facility] and was told they did not have it. Reviewed what [facility] informed me of. Encouraged her to call [facility] in a few days to check on status.   Patient asked about her Atarax , reviewed was sent to [facility] and to call [facility] to have it transferred. Patient voiced understanding.

## 2024-01-10 ENCOUNTER — Other Ambulatory Visit (HOSPITAL_COMMUNITY): Payer: Self-pay

## 2024-01-16 ENCOUNTER — Other Ambulatory Visit: Payer: Self-pay | Admitting: Family Medicine

## 2024-01-21 ENCOUNTER — Other Ambulatory Visit (HOSPITAL_COMMUNITY): Payer: Self-pay

## 2024-01-29 ENCOUNTER — Other Ambulatory Visit: Payer: Self-pay

## 2024-01-29 ENCOUNTER — Other Ambulatory Visit (HOSPITAL_COMMUNITY): Payer: Self-pay

## 2024-01-30 ENCOUNTER — Other Ambulatory Visit: Payer: Self-pay

## 2024-01-31 ENCOUNTER — Other Ambulatory Visit: Payer: Self-pay

## 2024-01-31 ENCOUNTER — Other Ambulatory Visit (HOSPITAL_COMMUNITY): Payer: Self-pay

## 2024-04-16 ENCOUNTER — Other Ambulatory Visit (HOSPITAL_COMMUNITY)
Admission: RE | Admit: 2024-04-16 | Discharge: 2024-04-16 | Disposition: A | Discharge status: Home / Self Care | Source: Ambulatory Visit | Attending: Primary Care | Admitting: Primary Care

## 2024-04-16 ENCOUNTER — Encounter (INDEPENDENT_AMBULATORY_CARE_PROVIDER_SITE_OTHER): Payer: Self-pay | Admitting: Primary Care

## 2024-04-16 ENCOUNTER — Ambulatory Visit (INDEPENDENT_AMBULATORY_CARE_PROVIDER_SITE_OTHER): Admitting: Primary Care

## 2024-04-16 VITALS — BP 118/77 | HR 70 | Resp 20 | Ht 66.4 in | Wt 241.2 lb

## 2024-04-16 DIAGNOSIS — E66812 Obesity, class 2: Secondary | ICD-10-CM | POA: Diagnosis not present

## 2024-04-16 DIAGNOSIS — D649 Anemia, unspecified: Secondary | ICD-10-CM

## 2024-04-16 DIAGNOSIS — N898 Other specified noninflammatory disorders of vagina: Secondary | ICD-10-CM

## 2024-04-16 DIAGNOSIS — G629 Polyneuropathy, unspecified: Secondary | ICD-10-CM

## 2024-04-16 DIAGNOSIS — E6609 Other obesity due to excess calories: Secondary | ICD-10-CM

## 2024-04-16 DIAGNOSIS — Z6838 Body mass index (BMI) 38.0-38.9, adult: Secondary | ICD-10-CM | POA: Diagnosis not present

## 2024-04-16 DIAGNOSIS — F17200 Nicotine dependence, unspecified, uncomplicated: Secondary | ICD-10-CM | POA: Diagnosis not present

## 2024-04-16 DIAGNOSIS — R7303 Prediabetes: Secondary | ICD-10-CM | POA: Diagnosis not present

## 2024-04-16 LAB — POCT GLYCOSYLATED HEMOGLOBIN (HGB A1C): HbA1c, POC (prediabetic range): 5.8 % (ref 5.7–6.4)

## 2024-04-16 MED ORDER — NICOTINE 21-14-7 MG/24HR TD KIT: 1.0000 | PACK | TRANSDERMAL | 0 refills | Status: DC

## 2024-04-16 MED ORDER — GABAPENTIN 100 MG PO CAPS: 100.0000 mg | ORAL_CAPSULE | Freq: Two times a day (BID) | ORAL | 1 refills | Status: AC

## 2024-04-16 NOTE — Progress Notes (Signed)
 [facility]  Pamela Wong, is a 28 y.o. female  RDW:[id number]  FMW:[id number]  DOB - 1995/12/02  Chief Complaint  Patient presents with   Medical Management of Chronic Issues       Subjective:   Pamela Wong is a 28 y.o. female here today for a follow up visit.  Patient is here today for medication refills.  She has been under a lot anxiety depression and stress due to having a preemie that has been in the hospital for 4 months on a CPAP and she will have to remain in the hospital until she is able to breathe on her own.  She is at [facility] every day visiting holding living on her little girl praying for the day that she gets to take her home.  Explained to her that hierarchy needs and survival rate for premature babies.  Someone else had already told her the same information.  Patient shared with me she has increased anxiety and depression, insomnia she wakes up in the middle of the night and sweats wondering if her baby is okay.  Very anxious when the phone rings is just a hospital what are they going to say question.  Explained to patient she needs to share her feelings what she is going through now a single parent with her OB/GYN and see if there is any resources to help her.  She is requesting blood work.  Patient has No headache, No chest pain, No abdominal pain - No Nausea, No new weakness tingling or numbness, No Cough - shortness of breath  No problems updated.  Comprehensive ROS Pertinent positive and negative noted in HPI   No Known Allergies  Past Medical History:  Diagnosis Date   Asthma    Depression    Fracture, ankle    rt and left when young   Hidradenitis suppurativa    PID (pelvic inflammatory disease) 03/2023   Trichomonas infection    UTI (urinary tract infection)     Current Outpatient Medications on File Prior to Visit  Medication Sig Dispense Refill   albuterol  (VENTOLIN  HFA) 108 (90 Base) MCG/ACT inhaler Inhale 1-2 puffs into the  lungs every 6 (six) hours as needed for wheezing or shortness of breath. (Patient not taking: Reported on 01/03/2024) 18 g 2   cyclobenzaprine  (FLEXERIL ) 10 MG tablet Take 1 tablet (10 mg total) by mouth 2 (two) times daily as needed for muscle spasms. 20 tablet 0   ibuprofen  (ADVIL ) 600 MG tablet Take 1 tablet (600 mg total) by mouth every 6 (six) hours as needed. 30 tablet 1   No current facility-administered medications on file prior to visit.   Health Maintenance  Topic Date Due   Pneumococcal Vaccine (1 of 2 - PCV) Never done   Hepatitis B Vaccine (1 of 3 - 19+ 3-dose series) Never done   HPV Vaccine (1 - 3-dose SCDM series) Never done   COVID-19 Vaccine (1 - 2024-25 season) Never done   Flu Shot  04/04/2024   Pap Smear  08/20/2026   DTaP/Tdap/Td vaccine (2 - Td or Tdap) 12/13/2026   Hepatitis C Screening  Completed   HIV Screening  Completed   Meningitis B Vaccine  Aged Out    Objective:   Vitals:   04/16/24 0920  BP: 118/77  Pulse: 70  Resp: 20  SpO2: 100%  Weight: 241 lb 3.2 oz (109.4 kg)  Height: 5' 6.4 (1.687 m)     Physical Exam Vitals reviewed.  Constitutional:  Appearance: She is obese.  HENT:     Head: Normocephalic.     Right Ear: Tympanic membrane and external ear normal.     Left Ear: Tympanic membrane, ear canal and external ear normal.     Nose: Nose normal.  Eyes:     Extraocular Movements: Extraocular movements intact.     Pupils: Pupils are equal, round, and reactive to light.  Cardiovascular:     Rate and Rhythm: Normal rate and regular rhythm.  Pulmonary:     Effort: Pulmonary effort is normal.     Breath sounds: Normal breath sounds.  Abdominal:     General: Bowel sounds are normal. There is distension.     Palpations: Abdomen is soft.     Tenderness: There is abdominal tenderness. There is guarding.  Musculoskeletal:        General: Normal range of motion.     Cervical back: Normal range of motion.  Skin:    General: Skin is warm  and dry.  Neurological:     Mental Status: She is oriented to person, place, and time.  Psychiatric:        Behavior: Behavior normal.      Assessment & Plan  Pamela Wong was seen today for medical management of chronic issues. Pamela Wong was seen today for medical management of chronic issues.  Diagnoses and all orders for this visit:  Class 2 obesity due to excess calories without serious comorbidity with body mass index (BMI) of 38.0 to 38.9 in adult Obesity is 30-39 indicating an excess in caloric intake or underlining conditions. This may lead to other co-morbidities. Educated on lifestyle modifications of diet and exercise which may reduce obesity.   -     Lipid Panel  Prediabetes -     Hemoglobin A1c  Anemia, unspecified type -     CBC with Differential -     CMP14+EGFR -     Iron, TIBC and Ferritin Panel  Tobacco use disorder - I have recommended complete cessation of tobacco use. I have discussed various options available for assistance with tobacco cessation including over the counter methods (Nicotine  gum, patch and lozenges). We also discussed prescription options (Chantix, Nicotine  Inhaler / Nasal Spray). The patient is interested in pursuing any prescription tobacco cessation options at this time.  - Less than 10 minutes spent on counseling.  -     Nicotine  21-14-7 MG/24HR KIT; Place 1 kit onto the skin as directed. Use as directed   Vaginal irritation -     Cervicovaginal ancillary only -     HIV antibody (with reflex)  Neuropathy -     gabapentin  (NEURONTIN ) 100 MG capsule; Take 1 capsule (100 mg total) by mouth 2 (two) times daily.     Patient have been counseled extensively about nutrition and exercise. Other issues discussed during this visit include: low cholesterol diet, weight control and daily exercise, foot care, annual eye examinations at Ophthalmology, importance of adherence with medications and regular follow-up. We also discussed long term  complications of uncontrolled diabetes and hypertension.   Return in about 3 months (around 07/17/2024) for medical conditions.  The patient was given clear instructions to go to ER or return to medical center if symptoms don't improve, worsen or new problems develop. The patient verbalized understanding. The patient was told to call to get lab results if they haven't heard anything in the next week.   This note has been created with Teaching laboratory technician and smart phrase  technology. Any transcriptional errors are unintentional.   Pamela SHAUNNA Bohr, NP 04/19/2024, 10:41 PM

## 2024-04-17 LAB — CERVICOVAGINAL ANCILLARY ONLY
Bacterial Vaginitis (gardnerella): POSITIVE — AB
Candida Glabrata: NEGATIVE
Candida Vaginitis: NEGATIVE
Chlamydia: NEGATIVE
Comment: NEGATIVE
Comment: NEGATIVE
Comment: NEGATIVE
Comment: NEGATIVE
Comment: NEGATIVE
Comment: NORMAL
Neisseria Gonorrhea: NEGATIVE
Trichomonas: POSITIVE — AB

## 2024-04-17 LAB — LIPID PANEL
Chol/HDL Ratio: 3 ratio (ref 0.0–4.4)
Cholesterol, Total: 187 mg/dL (ref 100–199)
HDL: 63 mg/dL (ref >39)
LDL Chol Calc (NIH): 107 mg/dL — ABNORMAL HIGH (ref 0–99)
Triglycerides: 96 mg/dL (ref 0–149)
VLDL Cholesterol Cal: 17 mg/dL (ref 5–40)

## 2024-04-17 LAB — CMP14+EGFR
ALT: 15 IU/L (ref 0–32)
AST: 25 IU/L (ref 0–40)
Albumin: 4 g/dL (ref 4.0–5.0)
Alkaline Phosphatase: 53 IU/L (ref 44–121)
BUN/Creatinine Ratio: 17 (ref 9–23)
BUN: 12 mg/dL (ref 6–20)
Bilirubin Total: <0.2 mg/dL (ref 0.0–1.2)
CO2: 17 mmol/L — ABNORMAL LOW (ref 20–29)
Calcium: 8.9 mg/dL (ref 8.7–10.2)
Chloride: 105 mmol/L (ref 96–106)
Creatinine, Ser: 0.69 mg/dL (ref 0.57–1.00)
Globulin, Total: 3.5 g/dL (ref 1.5–4.5)
Glucose: 109 mg/dL — ABNORMAL HIGH (ref 70–99)
Sodium: 136 mmol/L (ref 134–144)
Total Protein: 7.5 g/dL (ref 6.0–8.5)
eGFR: 121 mL/min/1.73 (ref >59)

## 2024-04-18 ENCOUNTER — Ambulatory Visit (INDEPENDENT_AMBULATORY_CARE_PROVIDER_SITE_OTHER): Payer: Self-pay | Admitting: Primary Care

## 2024-04-18 DIAGNOSIS — A599 Trichomoniasis, unspecified: Secondary | ICD-10-CM

## 2024-04-18 MED ORDER — METRONIDAZOLE 500 MG PO TABS: 500.0000 mg | ORAL_TABLET | Freq: Two times a day (BID) | ORAL | 0 refills | Status: DC

## 2024-05-22 ENCOUNTER — Emergency Department (HOSPITAL_COMMUNITY)
Admission: EM | Admit: 2024-05-22 | Discharge: 2024-05-22 | Disposition: A | Discharge status: Home / Self Care | Source: Ambulatory Visit | Attending: Emergency Medicine | Admitting: Emergency Medicine

## 2024-05-22 ENCOUNTER — Emergency Department (HOSPITAL_COMMUNITY)

## 2024-05-22 DIAGNOSIS — R1030 Lower abdominal pain, unspecified: Secondary | ICD-10-CM | POA: Insufficient documentation

## 2024-05-22 DIAGNOSIS — O209 Hemorrhage in early pregnancy, unspecified: Secondary | ICD-10-CM | POA: Insufficient documentation

## 2024-05-22 DIAGNOSIS — Z3A00 Weeks of gestation of pregnancy not specified: Secondary | ICD-10-CM | POA: Insufficient documentation

## 2024-05-22 LAB — CBC WITH DIFFERENTIAL/PLATELET
Abs Immature Granulocytes: 0.05 K/uL (ref 0.00–0.07)
Basophils Absolute: 0.1 K/uL (ref 0.0–0.1)
Basophils Relative: 1 %
Eosinophils Absolute: 0.2 K/uL (ref 0.0–0.5)
Eosinophils Relative: 2 %
HCT: 39.2 % (ref 36.0–46.0)
Hemoglobin: 12.4 g/dL (ref 12.0–15.0)
Immature Granulocytes: 1 %
Lymphocytes Relative: 34 %
Lymphs Abs: 3.1 K/uL (ref 0.7–4.0)
MCH: 29.1 pg (ref 26.0–34.0)
MCHC: 31.6 g/dL (ref 30.0–36.0)
MCV: 92 fL (ref 80.0–100.0)
Monocytes Absolute: 0.5 K/uL (ref 0.1–1.0)
Monocytes Relative: 5 %
Neutro Abs: 5.3 K/uL (ref 1.7–7.7)
Neutrophils Relative %: 57 %
Platelets: 244 K/uL (ref 150–400)
RBC: 4.26 MIL/uL (ref 3.87–5.11)
RDW: 16.6 % — ABNORMAL HIGH (ref 11.5–15.5)
WBC: 9.1 K/uL (ref 4.0–10.5)
nRBC: 0 % (ref 0.0–0.2)

## 2024-05-22 LAB — URINALYSIS, ROUTINE W REFLEX MICROSCOPIC
Bacteria, UA: NONE SEEN
Bilirubin Urine: NEGATIVE
Glucose, UA: NEGATIVE mg/dL
Ketones, ur: 5 mg/dL — AB
Leukocytes,Ua: NEGATIVE
Nitrite: NEGATIVE
Protein, ur: 30 mg/dL — AB
Specific Gravity, Urine: 1.027 (ref 1.005–1.030)
pH: 5 (ref 5.0–8.0)

## 2024-05-22 LAB — HCG, QUANTITATIVE, PREGNANCY: hCG, Beta Chain, Quant, S: 562 m[IU]/mL — ABNORMAL HIGH (ref <5)

## 2024-05-22 LAB — HCG, SERUM, QUALITATIVE: Preg, Serum: POSITIVE — AB

## 2024-05-22 MED ORDER — SODIUM CHLORIDE 0.9 % IV BOLUS
1000.0000 mL | Freq: Once | INTRAVENOUS | Status: AC
  Administered 2024-05-22: 1000 mL via INTRAVENOUS

## 2024-05-22 MED ORDER — ACETAMINOPHEN 325 MG PO TABS
650.0000 mg | ORAL_TABLET | Freq: Once | ORAL | Status: AC
  Administered 2024-05-22: 650 mg via ORAL
  Filled 2024-05-22: qty 2

## 2024-05-22 NOTE — Discharge Instructions (Signed)
 As we discussed, your hCG level was 560.  Your ultrasound did not show an intrauterine pregnancy.  [facility] check  Return to ER if you have severe abdominal pain or uncontrolled bleeding or fever

## 2024-05-22 NOTE — ED Triage Notes (Signed)
 Patient states she is having vaginal bleeding since 09.07.2025 and she does not feel it is her period. States she had a period in August. Had C section 6 months ago. Patient states she had not taken a pregnancy test but was seen at [facility] today and they informed her to go to ED that she was possibly having a miscarriage. Urine pregnancy test was positive at [facility]. Patient had her UC discharge paperwork. Patient states she is having some lower abdominal cramping. Rates pain 10/10.

## 2024-05-22 NOTE — ED Provider Notes (Signed)
  EMERGENCY DEPARTMENT AT [facility] Provider Note   CSN: [id number] Arrival date & time: 05/22/24  1943     Patient presents with: No chief complaint on file.   Pamela Wong is a 28 y.o. female G2, P1 here presenting with vaginal bleeding.  Patient has been bleeding for the last week or so.  Patient states that she has a 7-month-old who is currently in the NICU.  She states that she has some lower abdominal cramps as well.  Patient went to urgent care and had a positive pregnancy test.  Patient states that he has some lower abdominal cramps.  She states that she did not have ultrasound for this pregnancy yet.  Patient denies any vaginal discharge and denies any concerns for STDs   The history is provided by the patient.       Prior to Admission medications   Medication Sig Start Date End Date Taking? Authorizing Provider  albuterol  (VENTOLIN  HFA) 108 (90 Base) MCG/ACT inhaler Inhale 1-2 puffs into the lungs every 6 (six) hours as needed for wheezing or shortness of breath. Patient not taking: Reported on 01/03/2024 11/24/23   Fredirick Glenys GORMAN, MD  cyclobenzaprine  (FLEXERIL ) 10 MG tablet Take 1 tablet (10 mg total) by mouth 2 (two) times daily as needed for muscle spasms. 12/13/23   Cooleen, Olam LABOR, NP  gabapentin  (NEURONTIN ) 100 MG capsule Take 1 capsule (100 mg total) by mouth 2 (two) times daily. 04/16/24   Celestia Rosaline SQUIBB, NP  ibuprofen  (ADVIL ) 600 MG tablet Take 1 tablet (600 mg total) by mouth every 6 (six) hours as needed. 11/24/23   Fredirick Glenys GORMAN, MD  metroNIDAZOLE  (FLAGYL ) 500 MG tablet Take 1 tablet (500 mg total) by mouth 2 (two) times daily. 04/18/24   Celestia Rosaline SQUIBB, NP  Nicotine  21-14-7 MG/24HR KIT Place 1 kit onto the skin as directed. Use as directed 04/16/24   Celestia Rosaline SQUIBB, NP    Allergies: Patient has no known allergies.    Review of Systems  Genitourinary:  Positive for vaginal bleeding.  All other systems reviewed and are  negative.   Updated Vital Signs BP (!) 153/80 (BP Location: Left Arm)   Pulse 89   Temp 98.4 F (36.9 C) (Oral)   Resp 18   Ht 5' 6.4 (1.687 m)   Wt 111.1 kg   SpO2 99%   BMI 39.07 kg/m   Physical Exam Vitals and nursing note reviewed.  Constitutional:      Appearance: Normal appearance.  HENT:     Head: Normocephalic.     Nose: Nose normal.     Mouth/Throat:     Mouth: Mucous membranes are moist.  Eyes:     Extraocular Movements: Extraocular movements intact.     Pupils: Pupils are equal, round, and reactive to light.  Cardiovascular:     Rate and Rhythm: Normal rate and regular rhythm.     Pulses: Normal pulses.     Heart sounds: Normal heart sounds.  Pulmonary:     Effort: Pulmonary effort is normal.     Breath sounds: Normal breath sounds.  Abdominal:     General: Abdomen is flat.     Palpations: Abdomen is soft.     Comments: Mild diffuse lower abdominal tenderness  Musculoskeletal:        General: Normal range of motion.     Cervical back: Normal range of motion and neck supple.  Skin:    General: Skin is warm.  Capillary Refill: Capillary refill takes less than 2 seconds.  Neurological:     General: No focal deficit present.     Mental Status: She is alert and oriented to person, place, and time.  Psychiatric:        Mood and Affect: Mood normal.        Behavior: Behavior normal.     (all labs ordered are listed, but only abnormal results are displayed) Labs Reviewed  CBC WITH DIFFERENTIAL/PLATELET - Abnormal; Notable for the following components:      Result Value   RDW 16.6 (*)    All other components within normal limits  URINALYSIS, ROUTINE W REFLEX MICROSCOPIC - Abnormal; Notable for the following components:   APPearance HAZY (*)    Hgb urine dipstick LARGE (*)    Ketones, ur 5 (*)    Protein, ur 30 (*)    All other components within normal limits  HCG, SERUM, QUALITATIVE - Abnormal; Notable for the following components:   Preg, Serum  POSITIVE (*)    All other components within normal limits  HCG, QUANTITATIVE, PREGNANCY    EKG: None  Radiology: No results found.   Procedures   Medications Ordered in the ED  sodium chloride  0.9 % bolus 1,000 mL (has no administration in time range)  acetaminophen  (TYLENOL ) tablet 650 mg (650 mg Oral Given 05/22/24 2115)                                    Medical Decision Making Pamela Wong is a 28 y.o. female G2, P1 here presenting with vaginal bleeding.  Patient had a positive pregnancy test at urgent care.  Concern for possible ectopic versus early IUP versus miscarriage.  Plan to get quantitative hCG and transvaginal ultrasound.  11:01 PM Patient is globin is 12.4.  Patient's hCG is 562.  Ultrasound unable to identify IUP.  Considered miscarriage versus ectopic.  Recommend 2-day follow-up for hCG quant  Problems Addressed: First trimester bleeding: acute illness or injury  Amount and/or Complexity of Data Reviewed Labs: ordered. Radiology: ordered.  Risk OTC drugs.    Final diagnoses:  None    ED Discharge Orders     None          Patt Alm Macho, MD 05/22/24 2302

## 2024-05-23 ENCOUNTER — Telehealth: Payer: Self-pay | Admitting: Family Medicine

## 2024-05-23 NOTE — Telephone Encounter (Signed)
 Patient called and said she went to the emergency room last night, and the told her the urine test was positive for being pregnant. Then they did an ultrasound and didn't see the fetal pole. She is requesting a call back so someone can explain this to her.

## 2024-05-26 ENCOUNTER — Other Ambulatory Visit: Payer: Self-pay

## 2024-05-26 ENCOUNTER — Ambulatory Visit: Admitting: *Deleted

## 2024-05-26 VITALS — BP 126/85 | HR 75 | Ht 66.5 in | Wt 247.6 lb

## 2024-05-26 DIAGNOSIS — O039 Complete or unspecified spontaneous abortion without complication: Secondary | ICD-10-CM

## 2024-05-26 DIAGNOSIS — O3680X0 Pregnancy with inconclusive fetal viability, not applicable or unspecified: Secondary | ICD-10-CM

## 2024-05-26 DIAGNOSIS — Z3A00 Weeks of gestation of pregnancy not specified: Secondary | ICD-10-CM

## 2024-05-26 LAB — BETA HCG QUANT (REF LAB): hCG Quant: 339 m[IU]/mL

## 2024-05-26 NOTE — Telephone Encounter (Signed)
 Concern addressed today by nurse visit with Skip, RN.    Waddell, RN

## 2024-05-26 NOTE — Progress Notes (Signed)
 Here for stat bhcg. C/o still having intermittent cramping/ tenderness = 8 lower pelvis, states same as when went to ED c/o still having light period bleeding or spotting. Explained we will draw stat bhcg and call her with results and plan of care. She voices understanding. Also discussed her Bp elevated and she will need to follow up with a PCP for BP management. She voices understanding.  Rock Skip PEAK 2:44 bhcg results received and reviewed with Dr. Zina. He advised appears to be miscarriage and recommends follow up weekly non stat bhcg until 0. I called Fatemah and informed her of results and plan of care. She agreed to non stat bhcg in one week and scheduled for 06/02/24.  I also offered sab follow up which she accepted. I explained I will send a message to front office to schedule that appointment and contact her. She voices understanding. Rock Skip PEAK

## 2024-05-28 ENCOUNTER — Ambulatory Visit: Payer: Self-pay | Admitting: Obstetrics and Gynecology

## 2024-05-28 DIAGNOSIS — O039 Complete or unspecified spontaneous abortion without complication: Secondary | ICD-10-CM

## 2024-06-02 ENCOUNTER — Other Ambulatory Visit: Payer: Self-pay

## 2024-06-02 ENCOUNTER — Other Ambulatory Visit

## 2024-06-02 DIAGNOSIS — O039 Complete or unspecified spontaneous abortion without complication: Secondary | ICD-10-CM

## 2024-06-03 LAB — BETA HCG QUANT (REF LAB): hCG Quant: 6 m[IU]/mL

## 2024-06-03 NOTE — Telephone Encounter (Addendum)
 Advised pt of Hcg result and provider recommendation to check level in two weeks.  Pt agreeable to scheduling lab visit 06/16/24 at 10:00am.    Waddell, RN   ----- Message from Jerilynn DELENA Buddle sent at 06/03/2024  1:14 PM EDT ----- Bhcg significantly decreased repeat in 2 weeks ----- Message ----- From: Interface, [facility] Lab Results In Sent: 06/03/2024   8:37 AM EDT To: Jerilynn DELENA Buddle, MD

## 2024-06-05 ENCOUNTER — Ambulatory Visit: Admitting: Physician Assistant

## 2024-06-09 ENCOUNTER — Other Ambulatory Visit: Payer: Self-pay

## 2024-06-09 ENCOUNTER — Ambulatory Visit (INDEPENDENT_AMBULATORY_CARE_PROVIDER_SITE_OTHER): Admitting: Family Medicine

## 2024-06-09 VITALS — BP 113/78 | HR 78 | Wt 245.5 lb

## 2024-06-09 DIAGNOSIS — Z862 Personal history of diseases of the blood and blood-forming organs and certain disorders involving the immune mechanism: Secondary | ICD-10-CM

## 2024-06-09 DIAGNOSIS — Z3A00 Weeks of gestation of pregnancy not specified: Secondary | ICD-10-CM | POA: Diagnosis not present

## 2024-06-09 DIAGNOSIS — F419 Anxiety disorder, unspecified: Secondary | ICD-10-CM

## 2024-06-09 DIAGNOSIS — O039 Complete or unspecified spontaneous abortion without complication: Secondary | ICD-10-CM | POA: Diagnosis not present

## 2024-06-09 DIAGNOSIS — Z30013 Encounter for initial prescription of injectable contraceptive: Secondary | ICD-10-CM

## 2024-06-09 MED ORDER — HYDROXYZINE HCL 25 MG PO TABS: 25.0000 mg | ORAL_TABLET | Freq: Four times a day (QID) | ORAL | 2 refills | Status: DC | PRN

## 2024-06-09 MED ORDER — MEDROXYPROGESTERONE ACETATE 150 MG/ML IM SUSP
150.0000 mg | Freq: Once | INTRAMUSCULAR | Status: AC
  Administered 2024-06-09: 150 mg via INTRAMUSCULAR

## 2024-06-09 NOTE — Assessment & Plan Note (Signed)
 Repeating beta hCG today. Anticipate < 5 c/w SAB termination of pregnancy. If not, consider f/u hCG on 10/13 as scheduled.

## 2024-06-09 NOTE — Progress Notes (Signed)
   Subjective:    Patient ID: Pamela Wong is a 28 y.o. female presenting s/p SAB  on 06/09/2024  HPI: Patient concerned about being all cleared out following SAB 05/22/24. Understands that downtrending hCG c/w SAB and recent US  without evidence of IUP or intrauterine tissue.  Also interested in initiating contraception. Desires depot for convenience of quarterly injection despite possibility of weight gain. Not interested in daily pills or IUD.  Also discussed anxiety and refilling Atarax . She reports taking Atarax  25 mg TID PRN for anxiety and trouble falling asleep with racing thoughts about her baby in the NICU.  Review of Systems Patient denies continued bleeding, lightheadedness, SOB, fevers, and chills.    Objective:    BP 113/78   Pulse 78   Wt 111.4 kg   Breastfeeding No   BMI 39.03 kg/m  Physical Exam Constitutional:      General: She is not in acute distress.    Appearance: Normal appearance. She is not ill-appearing.  Pulmonary:     Effort: Pulmonary effort is normal. No respiratory distress.  Abdominal:     Palpations: Abdomen is soft.  Skin:    General: Skin is warm and dry.  Neurological:     Mental Status: She is alert. Mental status is at baseline.  Psychiatric:        Mood and Affect: Mood normal.        Speech: Speech normal.       Assessment & Plan:   Problem List Items Addressed This Visit     SAB (spontaneous abortion) - Primary   Repeating beta hCG today. Anticipate < 5 c/w SAB termination of pregnancy. If not, consider f/u hCG on 10/13 as scheduled.      Hx of iron deficiency anemia   Patient with normal hemoglobin 12.4 and no sxs c/w anemia. No iron supplementation indicated at this time.      Initiation of depot contraception performed   Giving first injection today with 66mo f/u scheduled. Patient counseled on possibility of bleeding towards the end of the 66mo window and amenorrhea after this. Also counseled on average weight gain  ~10 lbs and feelings of increased appetite. Alternatives, such as OCPs and IUD, discussed.      Anxiety   Relevant Medications   hydrOXYzine  (ATARAX ) 25 MG tablet    Return in about 3 months (around 09/09/2024) for Depo Provera .  Jayson LELON Ness, Medical Student 06/09/2024 10:38 AM

## 2024-06-09 NOTE — Assessment & Plan Note (Signed)
 Giving first injection today with 47mo f/u scheduled. Patient counseled on possibility of bleeding towards the end of the 47mo window and amenorrhea after this. Also counseled on average weight gain ~10 lbs and feelings of increased appetite. Alternatives, such as OCPs and IUD, discussed.

## 2024-06-09 NOTE — Assessment & Plan Note (Signed)
 Patient with normal hemoglobin 12.4 and no sxs c/w anemia. No iron supplementation indicated at this time.

## 2024-06-09 NOTE — Progress Notes (Signed)
 Patient seen in conjunction with the medical student.  I have taken the history and preformed the exam.  The above note has been edited as necessary.  Glenys GORMAN Birk, MD  06/09/2024 9:58 AM

## 2024-06-10 ENCOUNTER — Ambulatory Visit: Payer: Self-pay | Admitting: Family Medicine

## 2024-06-10 LAB — BETA HCG QUANT (REF LAB): hCG Quant: <1 m[IU]/mL

## 2024-06-16 ENCOUNTER — Other Ambulatory Visit

## 2024-06-16 ENCOUNTER — Other Ambulatory Visit: Payer: Self-pay

## 2024-06-16 DIAGNOSIS — O039 Complete or unspecified spontaneous abortion without complication: Secondary | ICD-10-CM

## 2024-06-17 ENCOUNTER — Ambulatory Visit: Payer: Self-pay | Admitting: Obstetrics and Gynecology

## 2024-06-17 LAB — BETA HCG QUANT (REF LAB): hCG Quant: <1 m[IU]/mL

## 2024-06-17 NOTE — Telephone Encounter (Signed)
 Called Pt to advise Bhcg negative, no further testing needed, Pt verbalized understanding.

## 2024-06-17 NOTE — Telephone Encounter (Signed)
-----   Message from Pamela Wong sent at 06/17/2024  9:59 AM EDT ----- Bhcg negative, no further testing needed ----- Message ----- From: Interface, [facility] Lab Results In Sent: 06/17/2024   4:36 AM EDT To: Pamela DELENA Buddle, MD

## 2024-07-17 ENCOUNTER — Ambulatory Visit (INDEPENDENT_AMBULATORY_CARE_PROVIDER_SITE_OTHER): Admitting: Primary Care

## 2024-08-12 ENCOUNTER — Ambulatory Visit (INDEPENDENT_AMBULATORY_CARE_PROVIDER_SITE_OTHER): Admitting: Primary Care

## 2024-08-12 VITALS — BP 130/83 | HR 98 | Resp 16 | Ht 66.5 in | Wt 250.6 lb

## 2024-08-12 DIAGNOSIS — J4520 Mild intermittent asthma, uncomplicated: Secondary | ICD-10-CM

## 2024-08-12 DIAGNOSIS — Z7251 High risk heterosexual behavior: Secondary | ICD-10-CM

## 2024-08-12 DIAGNOSIS — Z30019 Encounter for initial prescription of contraceptives, unspecified: Secondary | ICD-10-CM

## 2024-08-12 DIAGNOSIS — F419 Anxiety disorder, unspecified: Secondary | ICD-10-CM

## 2024-08-12 MED ORDER — ALBUTEROL SULFATE HFA 108 (90 BASE) MCG/ACT IN AERS: 1.0000 | INHALATION_SPRAY | Freq: Four times a day (QID) | RESPIRATORY_TRACT | 2 refills | Status: AC | PRN

## 2024-08-12 MED ORDER — HYDROXYZINE HCL 25 MG PO TABS: 25.0000 mg | ORAL_TABLET | Freq: Four times a day (QID) | ORAL | 1 refills | Status: AC | PRN

## 2024-08-12 NOTE — Progress Notes (Unsigned)
 [facility]  Pamela Wong, is a 28 y.o. female  RDW:[id number]  FMW:[id number]  DOB - 1995-09-30  Chief Complaint  Patient presents with   Medication Refill       Subjective:   Pamela Wong is a 28 y.o. female here today for an acute visit. No LMP recorded.  Unknown 1 month ago states she was on depo will ck pregnancy test prior to birthcontrol with recent miscarriage. Un protective sex with partner,  HPI  No problems updated.  Comprehensive ROS Pertinent positive and negative noted in HPI   No Known Allergies  Past Medical History:  Diagnosis Date   Asthma    Depression    Fracture, ankle    rt and left when young   Hidradenitis suppurativa    PID (pelvic inflammatory disease) 03/2023   Trichomonas infection    UTI (urinary tract infection)     Current Outpatient Medications on File Prior to Visit  Medication Sig Dispense Refill   cyclobenzaprine  (FLEXERIL ) 10 MG tablet Take 1 tablet (10 mg total) by mouth 2 (two) times daily as needed for muscle spasms. (Patient not taking: Reported on 05/26/2024) 20 tablet 0   gabapentin  (NEURONTIN ) 100 MG capsule Take 1 capsule (100 mg total) by mouth 2 (two) times daily. 180 capsule 1   No current facility-administered medications on file prior to visit.   Health Maintenance  Topic Date Due   COVID-19 Vaccine (1) Never done   Pneumococcal Vaccine (1 of 2 - PCV) Never done   Hepatitis B Vaccine (1 of 3 - 19+ 3-dose series) Never done   HPV Vaccine (1 - 3-dose SCDM series) Never done   Flu Shot  Never done   Pap Smear  08/20/2026   DTaP/Tdap/Td vaccine (2 - Td or Tdap) 12/13/2026   Hepatitis C Screening  Completed   HIV Screening  Completed   Meningitis B Vaccine  Aged Out    Objective:  There were no vitals taken for this visit.   Physical Exam Vitals reviewed.  Constitutional:      Appearance: Normal appearance. She is obese.  HENT:     Head: Normocephalic.     Right Ear: Tympanic  membrane, ear canal and external ear normal.     Left Ear: Tympanic membrane, ear canal and external ear normal.     Nose: Nose normal.     Mouth/Throat:     Mouth: Mucous membranes are moist.  Eyes:     Extraocular Movements: Extraocular movements intact.     Pupils: Pupils are equal, round, and reactive to light.  Cardiovascular:     Rate and Rhythm: Normal rate.  Pulmonary:     Effort: Pulmonary effort is normal.     Breath sounds: Normal breath sounds.  Abdominal:     General: Bowel sounds are normal.     Palpations: Abdomen is soft.  Musculoskeletal:        General: Normal range of motion.     Cervical back: Normal range of motion.  Skin:    General: Skin is warm and dry.  Neurological:     Mental Status: She is alert and oriented to person, place, and time.  Psychiatric:        Mood and Affect: Mood normal.        Behavior: Behavior normal.        Thought Content: Thought content normal.     Assessment & Plan   Pamela Wong was seen today for medication refill.  Diagnoses  and all orders for this visit:  Encounter for female birth control  Unprotected sex -     POCT urine pregnancy -     hCG, quantitative, pregnancy  Anxiety -     hydrOXYzine  (ATARAX ) 25 MG tablet; Take 1 tablet (25 mg total) by mouth every 6 (six) hours as needed for itching.  Mild intermittent asthma without complication -     albuterol  (VENTOLIN  HFA) 108 (90 Base) MCG/ACT inhaler; Inhale 1-2 puffs into the lungs every 6 (six) hours as needed for wheezing or shortness of breath.    Patient have been counseled extensively about nutrition and exercise. Other issues discussed during this visit include: low cholesterol diet, weight control and daily exercise, foot care, annual eye examinations at Ophthalmology, importance of adherence with medications and regular follow-up. We also discussed long term complications of uncontrolled diabetes and hypertension.   Return in about 3 months (around  11/10/2024).  The patient was given clear instructions to go to ER or return to medical center if symptoms don't improve, worsen or new problems develop. The patient verbalized understanding. The patient was told to call to get lab results if they haven't heard anything in the next week.   This note has been created with Education officer, environmental. Any transcriptional errors are unintentional.   Pamela SHAUNNA Bohr, NP 08/17/2024, 9:36 PM

## 2024-08-25 ENCOUNTER — Other Ambulatory Visit: Payer: Self-pay

## 2024-08-25 ENCOUNTER — Ambulatory Visit

## 2024-08-25 VITALS — BP 130/79 | HR 62 | Ht 66.5 in | Wt 251.5 lb

## 2024-08-25 DIAGNOSIS — Z3042 Encounter for surveillance of injectable contraceptive: Secondary | ICD-10-CM | POA: Diagnosis not present

## 2024-08-25 MED ORDER — MEDROXYPROGESTERONE ACETATE 150 MG/ML IM SUSY
150.0000 mg | PREFILLED_SYRINGE | Freq: Once | INTRAMUSCULAR | Status: AC
  Administered 2024-08-25: 150 mg via INTRAMUSCULAR

## 2024-08-25 NOTE — Progress Notes (Signed)
 Pamela Wong here for Depo-Provera  Injection. Injection administered without complication. Patient will return in 3 months for next injection between 11/10/24 and 11/24/24. Next annual visit due after 01/02/25.   [facility] 08/25/2024  10:45 AM

## 2024-08-26 ENCOUNTER — Other Ambulatory Visit (INDEPENDENT_AMBULATORY_CARE_PROVIDER_SITE_OTHER): Payer: Self-pay | Admitting: Nurse Practitioner

## 2024-11-10 ENCOUNTER — Ambulatory Visit: Payer: Self-pay
